# Patient Record
Sex: Female | Born: 1995 | Race: White | Hispanic: No | Marital: Single | Smoking: Former smoker
Health system: Southern US, Community
[De-identification: ages and names within clinical notes are randomized; demographics above are authoritative.]

## PROBLEM LIST (undated history)

## (undated) DIAGNOSIS — R112 Nausea with vomiting, unspecified: Secondary | ICD-10-CM

## (undated) DIAGNOSIS — F419 Anxiety disorder, unspecified: Secondary | ICD-10-CM

## (undated) DIAGNOSIS — F329 Major depressive disorder, single episode, unspecified: Secondary | ICD-10-CM

## (undated) DIAGNOSIS — Z9889 Other specified postprocedural states: Secondary | ICD-10-CM

## (undated) DIAGNOSIS — F32A Depression, unspecified: Secondary | ICD-10-CM

## (undated) DIAGNOSIS — F319 Bipolar disorder, unspecified: Secondary | ICD-10-CM

## (undated) HISTORY — PX: WISDOM TOOTH EXTRACTION: SHX21

---
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
## 2016-06-11 ENCOUNTER — Emergency Department (HOSPITAL_COMMUNITY)
Admission: EM | Admit: 2016-06-11 | Discharge: 2016-06-11 | Discharge status: Against Medical Advice | Payer: Self-pay | Attending: Emergency Medicine | Admitting: Emergency Medicine

## 2016-06-11 ENCOUNTER — Emergency Department (HOSPITAL_COMMUNITY): Payer: Self-pay

## 2016-06-11 ENCOUNTER — Encounter (HOSPITAL_COMMUNITY): Payer: Self-pay | Admitting: Nurse Practitioner

## 2016-06-11 DIAGNOSIS — R102 Pelvic and perineal pain: Secondary | ICD-10-CM

## 2016-06-11 DIAGNOSIS — Z3490 Encounter for supervision of normal pregnancy, unspecified, unspecified trimester: Secondary | ICD-10-CM

## 2016-06-11 DIAGNOSIS — Z3A01 Less than 8 weeks gestation of pregnancy: Secondary | ICD-10-CM | POA: Insufficient documentation

## 2016-06-11 DIAGNOSIS — O209 Hemorrhage in early pregnancy, unspecified: Secondary | ICD-10-CM | POA: Insufficient documentation

## 2016-06-11 DIAGNOSIS — N939 Abnormal uterine and vaginal bleeding, unspecified: Secondary | ICD-10-CM

## 2016-06-11 HISTORY — DX: Bipolar disorder, unspecified: F31.9

## 2016-06-11 HISTORY — DX: Anxiety disorder, unspecified: F41.9

## 2016-06-11 LAB — CBC WITH DIFFERENTIAL/PLATELET
Basophils Absolute: 0 10*3/uL (ref 0.0–0.1)
Basophils Relative: 0 %
Eosinophils Absolute: 0.1 10*3/uL (ref 0.0–0.7)
Eosinophils Relative: 1 %
HCT: 38.4 % (ref 36.0–46.0)
Hemoglobin: 12.4 g/dL (ref 12.0–15.0)
Lymphocytes Relative: 30 %
Lymphs Abs: 2.2 10*3/uL (ref 0.7–4.0)
MCH: 26.8 pg (ref 26.0–34.0)
MCHC: 32.3 g/dL (ref 30.0–36.0)
MCV: 82.9 fL (ref 78.0–100.0)
Monocytes Absolute: 0.6 10*3/uL (ref 0.1–1.0)
Monocytes Relative: 8 %
Neutro Abs: 4.6 10*3/uL (ref 1.7–7.7)
Neutrophils Relative %: 61 %
Platelets: 189 10*3/uL (ref 150–400)
RBC: 4.63 MIL/uL (ref 3.87–5.11)
RDW: 13.2 % (ref 11.5–15.5)
WBC: 7.5 10*3/uL (ref 4.0–10.5)

## 2016-06-11 LAB — URINE MICROSCOPIC-ADD ON

## 2016-06-11 LAB — URINALYSIS, ROUTINE W REFLEX MICROSCOPIC
Bilirubin Urine: NEGATIVE
Glucose, UA: NEGATIVE mg/dL
Ketones, ur: 15 mg/dL — AB
Nitrite: NEGATIVE
Protein, ur: NEGATIVE mg/dL
Specific Gravity, Urine: 1.013 (ref 1.005–1.030)
pH: 6 (ref 5.0–8.0)

## 2016-06-11 LAB — BASIC METABOLIC PANEL
Anion gap: 5 (ref 5–15)
BUN: 8 mg/dL (ref 6–20)
CO2: 25 mmol/L (ref 22–32)
Calcium: 9 mg/dL (ref 8.9–10.3)
Chloride: 106 mmol/L (ref 101–111)
Creatinine, Ser: 0.69 mg/dL (ref 0.44–1.00)
GFR calc Af Amer: >60 mL/min (ref >60)
GFR calc non Af Amer: >60 mL/min (ref >60)
Glucose, Bld: 96 mg/dL (ref 65–99)
Potassium: 3.4 mmol/L — ABNORMAL LOW (ref 3.5–5.1)
Sodium: 136 mmol/L (ref 135–145)

## 2016-06-11 LAB — WET PREP, GENITAL
Sperm: NONE SEEN
Trich, Wet Prep: NONE SEEN
Yeast Wet Prep HPF POC: NONE SEEN

## 2016-06-11 LAB — I-STAT BETA HCG BLOOD, ED (MC, WL, AP ONLY): I-stat hCG, quantitative: 904.2 m[IU]/mL — ABNORMAL HIGH (ref <5)

## 2016-06-11 LAB — ABO/RH: ABO/RH(D): A NEG

## 2016-06-11 NOTE — ED Notes (Signed)
PATIENT LEFT AMA 

## 2016-06-11 NOTE — ED Notes (Addendum)
Pt c/o onset pelvic cramping and dark vaginal bleeding this afternoon. Pain has decreased since onset. She is approximately [redacted] weeks pregnant, confirmed by PCP but has not been to OBGYN yet. She is alert and breathing easily

## 2016-06-11 NOTE — ED Notes (Signed)
Unable to locate pt. at room and at ER , pt. left AMA, EDP notified.

## 2016-06-11 NOTE — ED Provider Notes (Signed)
CSN: [id number]     Arrival date & time 06/11/16  1717 History   First MD Initiated Contact with Patient 06/11/16 1944     Chief Complaint  Patient presents with  . Vaginal Bleeding     (Consider location/radiation/quality/duration/timing/severity/associated sxs/prior Treatment) HPI Comments: Patient is a 20 year old pregnant female who presents with pelvic cramping and vaginal bleeding that began today. Patient reports she began having cramping while she was at work today. Patient was told that she could have some cramping and was not worried him until she began having dark vaginal bleeding. 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 Patient could not quantify how many pads she had filled due to using a brief. Patient has had some nausea over the past week, but no pelvic pain. Patient states she has not vomited, but has gagged once or twice without any productivity. Patient was seen by her PCP and was told she was [redacted] weeks pregnant due to her last menstrual period eating at the end of April. Patient denies any abnormal vaginal discharge. Patient has not taken any medications for her pain at home. Patient has an appointment with an OB/GYN tomorrow for her first appointment. Patient denies any chest pain, shortness of breath, vomiting, dysuria.  Patient is a 20 y.o. female presenting with vaginal bleeding. The history is provided by the patient.  Vaginal Bleeding Associated symptoms: nausea   Associated symptoms: no abdominal pain, no back pain, no dysuria and no fever     Past Medical History  Diagnosis Date  . Anxiety   . Bipolar disorder (HCC)    History reviewed. No pertinent past surgical history. History reviewed. No pertinent family history. Social History  Substance Use Topics  . Smoking status: Never Smoker   . Smokeless tobacco: None  . Alcohol Use: No   OB History    No data available     Review of Systems  Constitutional: Negative for fever and chills.  HENT:  Negative for facial swelling and sore throat.   Respiratory: Negative for shortness of breath.   Cardiovascular: Negative for chest pain.  Gastrointestinal: Positive for nausea. Negative for vomiting and abdominal pain.  Genitourinary: Positive for vaginal bleeding and pelvic pain. Negative for dysuria.  Musculoskeletal: Negative for back pain.  Skin: Negative for rash and wound.  Neurological: Negative for headaches.  Psychiatric/Behavioral: The patient is not nervous/anxious.       Allergies  Codeine  Home Medications   Prior to Admission medications   Not on File   BP 114/68 mmHg  Pulse 68  Temp(Src) 98.3 F (36.8 C) (Oral)  Resp 19  SpO2 100%  LMP 04/21/2016 Physical Exam  Constitutional: She appears well-developed and well-nourished. No distress.  HENT:  Head: Normocephalic and atraumatic.  Mouth/Throat: Oropharynx is clear and moist. No oropharyngeal exudate.  Eyes: Conjunctivae are normal. Pupils are equal, round, and reactive to light. Right eye exhibits no discharge. Left eye exhibits no discharge. No scleral icterus.  Neck: Normal range of motion. Neck supple. No thyromegaly present.  Cardiovascular: Normal rate, regular rhythm, normal heart sounds and intact distal pulses.  Exam reveals no gallop and no friction rub.   No murmur heard. Pulmonary/Chest: Effort normal and breath sounds normal. No stridor. No respiratory distress. She has no wheezes. She has no rales.  Abdominal: Soft. Bowel sounds are normal. She exhibits no distension. There is tenderness in the suprapubic area and left lower quadrant. There is no rebound and no guarding.  Genitourinary: There is no rash, tenderness or lesion on the right labia. There is no rash, tenderness or lesion on the left labia. Uterus is enlarged and tender. Cervix exhibits motion tenderness. Right adnexum displays tenderness. Right adnexum displays no mass and no fullness. Left adnexum displays no mass, no tenderness and  no fullness. There is bleeding in the vagina. No erythema or tenderness in the vagina. No foreign body around the vagina. No vaginal discharge found.  Bleeding with clots on pelvic exam  Musculoskeletal: She exhibits no edema.  Lymphadenopathy:    She has no cervical adenopathy.  Neurological: She is alert. Coordination normal.  Skin: Skin is warm and dry. No rash noted. She is not diaphoretic. No pallor.  Psychiatric: She has a normal mood and affect.  Nursing note and vitals reviewed.   ED Course  Procedures (including critical care time) Labs Review Labs Reviewed  I-STAT BETA HCG BLOOD, ED ([facility], [facility], [facility] ONLY) - Abnormal; Notable for the following:    I-stat hCG, quantitative 904.2 (*)    All other components within normal limits  WET PREP, GENITAL  BASIC METABOLIC PANEL  CBC WITH DIFFERENTIAL/PLATELET  RPR  HIV ANTIBODY (ROUTINE TESTING)  URINALYSIS, ROUTINE W REFLEX MICROSCOPIC (NOT AT [facility])  GC/CHLAMYDIA PROBE AMP ([place]) NOT AT [facility]    Imaging Review No results found. I have personally reviewed and evaluated these images and lab results as part of my medical decision-making.   EKG Interpretation None      MDM   CBC unremarkable. BMP shows potassium 3.4. UA shows large hematuria, 15 ketones, trace leukocytes, rare bacteria. Wet prep shows clue cells and moderate WBCs. GC chlamydia, RPR, HIV sent. Patient made aware that she will be called in 2-3 days if any of her results return positive. I made the patient aware that she should follow-up with the health Department or her OB/GYN if she is positive to arrange treatment. OB ultrasound shows no intrauterine gestational sac, yolk sac or fetal pole. Differential considerations include IUP too early to be sonographically visualized, missed abortion, or ectopic pregnancy; follow-up ultrasound is recommended in 10-14 days for further evaluation. Patient left AMA before discharge paperwork was given. I had spoken to patient and  was getting ready to discharge her while waiting for Rh factor to result. Rh factor is negative, and would have given Rhogam had patient not left. Patient had plans to follow up with OB/GYN tomorrow morning at her scheduled appointment. Strict return precautions were given. I advised patient she could take Tylenol for her pain. Patient vitals stable throughout ED course. I discussed patient case with Dr. Rubin Payor who is in agreement with plan.  Final diagnoses:  Vaginal bleeding  Pelvic cramping  Pregnant        Emi Holes, PA-C 06/12/16 0042  Benjiman Core, MD 06/13/16 2138

## 2016-06-12 LAB — GC/CHLAMYDIA PROBE AMP (~~LOC~~) NOT AT ARMC
Chlamydia: NEGATIVE
Neisseria Gonorrhea: NEGATIVE

## 2016-06-12 LAB — RPR: RPR Ser Ql: NONREACTIVE

## 2016-06-12 LAB — HIV ANTIBODY (ROUTINE TESTING W REFLEX): HIV Screen 4th Generation wRfx: NONREACTIVE

## 2016-07-17 ENCOUNTER — Emergency Department (HOSPITAL_COMMUNITY)
Admission: EM | Admit: 2016-07-17 | Discharge: 2016-07-18 | Disposition: A | Discharge status: Home / Self Care | Payer: Self-pay | Attending: Emergency Medicine | Admitting: Emergency Medicine

## 2016-07-17 ENCOUNTER — Emergency Department (HOSPITAL_COMMUNITY): Payer: Self-pay

## 2016-07-17 ENCOUNTER — Encounter (HOSPITAL_COMMUNITY): Payer: Self-pay | Admitting: Emergency Medicine

## 2016-07-17 DIAGNOSIS — Z79899 Other long term (current) drug therapy: Secondary | ICD-10-CM | POA: Insufficient documentation

## 2016-07-17 DIAGNOSIS — F319 Bipolar disorder, unspecified: Secondary | ICD-10-CM | POA: Insufficient documentation

## 2016-07-17 DIAGNOSIS — Z791 Long term (current) use of non-steroidal anti-inflammatories (NSAID): Secondary | ICD-10-CM | POA: Insufficient documentation

## 2016-07-17 DIAGNOSIS — R509 Fever, unspecified: Secondary | ICD-10-CM | POA: Insufficient documentation

## 2016-07-17 DIAGNOSIS — N12 Tubulo-interstitial nephritis, not specified as acute or chronic: Secondary | ICD-10-CM | POA: Insufficient documentation

## 2016-07-17 LAB — CBC
HCT: 38.1 % (ref 36.0–46.0)
Hemoglobin: 13 g/dL (ref 12.0–15.0)
MCH: 27.8 pg (ref 26.0–34.0)
MCHC: 34.1 g/dL (ref 30.0–36.0)
MCV: 81.4 fL (ref 78.0–100.0)
PLATELETS: 187 10*3/uL (ref 150–400)
RBC: 4.68 MIL/uL (ref 3.87–5.11)
RDW: 13.1 % (ref 11.5–15.5)
WBC: 9.8 10*3/uL (ref 4.0–10.5)

## 2016-07-17 LAB — URINE MICROSCOPIC-ADD ON

## 2016-07-17 LAB — COMPREHENSIVE METABOLIC PANEL
ALBUMIN: 4.1 g/dL (ref 3.5–5.0)
ALT: 13 U/L — ABNORMAL LOW (ref 14–54)
AST: 16 U/L (ref 15–41)
Alkaline Phosphatase: 78 U/L (ref 38–126)
Anion gap: 8 (ref 5–15)
BUN: 14 mg/dL (ref 6–20)
CHLORIDE: 106 mmol/L (ref 101–111)
CO2: 21 mmol/L — ABNORMAL LOW (ref 22–32)
CREATININE: 0.66 mg/dL (ref 0.44–1.00)
Calcium: 9.1 mg/dL (ref 8.9–10.3)
GFR calc Af Amer: >60 mL/min (ref >60)
GFR calc non Af Amer: >60 mL/min (ref >60)
GLUCOSE: 118 mg/dL — AB (ref 65–99)
POTASSIUM: 3.1 mmol/L — AB (ref 3.5–5.1)
Sodium: 135 mmol/L (ref 135–145)
Total Bilirubin: 0.8 mg/dL (ref 0.3–1.2)
Total Protein: 7.2 g/dL (ref 6.5–8.1)

## 2016-07-17 LAB — URINALYSIS, ROUTINE W REFLEX MICROSCOPIC
Bilirubin Urine: NEGATIVE
GLUCOSE, UA: NEGATIVE mg/dL
Ketones, ur: 40 mg/dL — AB
Nitrite: NEGATIVE
PROTEIN: NEGATIVE mg/dL
Specific Gravity, Urine: 1.011 (ref 1.005–1.030)
pH: 6 (ref 5.0–8.0)

## 2016-07-17 LAB — LIPASE, BLOOD: LIPASE: 19 U/L (ref 11–51)

## 2016-07-17 LAB — I-STAT BETA HCG BLOOD, ED (MC, WL, AP ONLY): I-stat hCG, quantitative: <5 m[IU]/mL (ref <5)

## 2016-07-17 MED ORDER — DEXTROSE 5 % IV SOLN
1.0000 g | INTRAVENOUS | Status: DC
  Administered 2016-07-18: 1 g via INTRAVENOUS
  Filled 2016-07-17: qty 10

## 2016-07-17 MED ORDER — IOPAMIDOL (ISOVUE-300) INJECTION 61%
100.0000 mL | Freq: Once | INTRAVENOUS | Status: AC | PRN
  Administered 2016-07-17: 100 mL via INTRAVENOUS

## 2016-07-17 MED ORDER — OXYCODONE-ACETAMINOPHEN 5-325 MG PO TABS: 1.0000 | ORAL_TABLET | ORAL | Status: DC | PRN

## 2016-07-17 MED ORDER — CEPHALEXIN 500 MG PO CAPS: 500.0000 mg | ORAL_CAPSULE | Freq: Four times a day (QID) | ORAL | Status: DC

## 2016-07-17 MED ORDER — SODIUM CHLORIDE 0.9 % IV BOLUS (SEPSIS)
1000.0000 mL | Freq: Once | INTRAVENOUS | Status: AC
  Administered 2016-07-17: 1000 mL via INTRAVENOUS

## 2016-07-17 MED ORDER — SODIUM CHLORIDE 0.9 % IV SOLN
INTRAVENOUS | Status: DC
  Administered 2016-07-18: via INTRAVENOUS

## 2016-07-17 MED ORDER — ONDANSETRON HCL 4 MG/2ML IJ SOLN
4.0000 mg | Freq: Once | INTRAMUSCULAR | Status: AC
  Administered 2016-07-17: 4 mg via INTRAVENOUS
  Filled 2016-07-17: qty 2

## 2016-07-17 MED ORDER — ONDANSETRON 4 MG PO TBDP
4.0000 mg | ORAL_TABLET | Freq: Once | ORAL | Status: AC | PRN
  Administered 2016-07-17: 4 mg via ORAL
  Filled 2016-07-17: qty 1

## 2016-07-17 NOTE — ED Notes (Signed)
Patient transported to CT 

## 2016-07-17 NOTE — ED Notes (Signed)
Pt states she has had abdominal pain and fever since yesterday. States she has been SOB and a fever as high as 103 at home. 99.2 now with tylenol at 6pm. Alert and oriented. Sent from [place] for further eval.

## 2016-07-17 NOTE — ED Provider Notes (Signed)
CSN: [id number]     Arrival date & time 07/17/16  1955 History   First MD Initiated Contact with Patient 07/17/16 2135     Chief Complaint  Patient presents with  . Abdominal Pain  . Fever     (Consider location/radiation/quality/duration/timing/severity/associated sxs/prior Treatment) HPI Comments: 21 year old female presents with one-day history of left lower quadrant abdominal pain with fever. Pain is characterized as sharp and worse with movement. Has had some urinary frequency. No vomiting or diarrhea. Is on the last day of her menses. Symptoms worse with walking. No prior history of same. No treatment use prior to arrival. Micah Flesher to urgent care and sent to further evaluation.  Patient is a 20 y.o. female presenting with abdominal pain and fever. The history is provided by the patient and a parent.  Abdominal Pain Associated symptoms: fever   Fever   Past Medical History  Diagnosis Date  . Anxiety   . Bipolar disorder (HCC)    History reviewed. No pertinent past surgical history. History reviewed. No pertinent family history. Social History  Substance Use Topics  . Smoking status: Never Smoker   . Smokeless tobacco: None  . Alcohol Use: No   OB History    No data available     Review of Systems  Constitutional: Positive for fever.  Gastrointestinal: Positive for abdominal pain.  All other systems reviewed and are negative.     Allergies  Codeine  Home Medications   Prior to Admission medications   Medication Sig Start Date End Date Taking? Authorizing Provider  amphetamine-dextroamphetamine (ADDERALL) 5 MG tablet Take 5 mg by mouth daily as needed (for attention).    Historical Provider, MD  ibuprofen (ADVIL,MOTRIN) 800 MG tablet Take 800 mg by mouth every 8 (eight) hours as needed for moderate pain.    Historical Provider, MD  Prenatal Vit-Fe Fumarate-FA (MULTIVITAMIN-PRENATAL) 27-0.8 MG TABS tablet Take 1 tablet by mouth daily at 12 noon.    Historical  Provider, MD  propranolol (INDERAL) 10 MG tablet Take 10 mg by mouth daily as needed (for anxiety).    Historical Provider, MD   BP 110/63 mmHg  Pulse 116  Temp(Src) 99.2 F (37.3 C) (Oral)  Resp 18  SpO2 97%  LMP 07/12/2016 (Exact Date) Physical Exam  Constitutional: She is oriented to person, place, and time. She appears well-developed and well-nourished.  Non-toxic appearance. No distress.  HENT:  Head: Normocephalic and atraumatic.  Eyes: Conjunctivae, EOM and lids are normal. Pupils are equal, round, and reactive to light.  Neck: Normal range of motion. Neck supple. No tracheal deviation present. No thyroid mass present.  Cardiovascular: Regular rhythm and normal heart sounds.  Tachycardia present.  Exam reveals no gallop.   No murmur heard. Pulmonary/Chest: Effort normal and breath sounds normal. No stridor. No respiratory distress. She has no decreased breath sounds. She has no wheezes. She has no rhonchi. She has no rales.  Abdominal: Soft. Normal appearance and bowel sounds are normal. She exhibits no distension. There is tenderness in the left lower quadrant. There is guarding. There is no rigidity, no rebound and no CVA tenderness.    Musculoskeletal: Normal range of motion. She exhibits no edema or tenderness.  Neurological: She is alert and oriented to person, place, and time. She has normal strength. No cranial nerve deficit or sensory deficit. GCS eye subscore is 4. GCS verbal subscore is 5. GCS motor subscore is 6.  Skin: Skin is warm and dry. No abrasion and no rash noted.  Psychiatric: She has a normal mood and affect. Her speech is normal and behavior is normal.  Nursing note and vitals reviewed.   ED Course  Procedures (including critical care time) Labs Review Labs Reviewed  COMPREHENSIVE METABOLIC PANEL - Abnormal; Notable for the following:    Potassium 3.1 (*)    CO2 21 (*)    Glucose, Bld 118 (*)    ALT 13 (*)    All other components within normal limits   LIPASE, BLOOD  CBC  URINALYSIS, ROUTINE W REFLEX MICROSCOPIC (NOT AT [facility])  I-STAT BETA HCG BLOOD, ED ([facility], [facility], [facility] ONLY)    Imaging Review No results found. I have personally reviewed and evaluated these images and lab results as part of my medical decision-making.   EKG Interpretation None      MDM   Final diagnoses:  None    Patient given dose of IV Rocephin here and placed on Keflex as well as Percocet for pain    Lorre NickAnthony Kellis Mcadam, MD 07/17/16 2358

## 2016-07-17 NOTE — Discharge Instructions (Signed)

## 2016-07-18 MED ORDER — MORPHINE SULFATE (PF) 4 MG/ML IV SOLN
4.0000 mg | Freq: Once | INTRAVENOUS | Status: AC
  Administered 2016-07-18: 4 mg via INTRAVENOUS
  Filled 2016-07-18: qty 1

## 2016-07-18 MED ORDER — KETOROLAC TROMETHAMINE 30 MG/ML IJ SOLN
30.0000 mg | Freq: Once | INTRAMUSCULAR | Status: AC
  Administered 2016-07-18: 30 mg via INTRAVENOUS
  Filled 2016-07-18: qty 1

## 2016-07-18 MED ORDER — ONDANSETRON HCL 4 MG/2ML IJ SOLN
4.0000 mg | Freq: Once | INTRAMUSCULAR | Status: AC
  Administered 2016-07-18: 4 mg via INTRAVENOUS
  Filled 2016-07-18: qty 2

## 2016-07-22 ENCOUNTER — Emergency Department (HOSPITAL_COMMUNITY): Payer: 59

## 2016-07-22 ENCOUNTER — Emergency Department (HOSPITAL_COMMUNITY)
Admission: EM | Admit: 2016-07-22 | Discharge: 2016-07-22 | Disposition: A | Discharge status: Home / Self Care | Payer: 59 | Attending: Emergency Medicine | Admitting: Emergency Medicine

## 2016-07-22 ENCOUNTER — Encounter (HOSPITAL_COMMUNITY): Payer: Self-pay

## 2016-07-22 DIAGNOSIS — Z791 Long term (current) use of non-steroidal anti-inflammatories (NSAID): Secondary | ICD-10-CM | POA: Diagnosis not present

## 2016-07-22 DIAGNOSIS — R1084 Generalized abdominal pain: Secondary | ICD-10-CM | POA: Diagnosis not present

## 2016-07-22 DIAGNOSIS — Z79899 Other long term (current) drug therapy: Secondary | ICD-10-CM | POA: Diagnosis not present

## 2016-07-22 DIAGNOSIS — R112 Nausea with vomiting, unspecified: Secondary | ICD-10-CM | POA: Insufficient documentation

## 2016-07-22 DIAGNOSIS — Z79891 Long term (current) use of opiate analgesic: Secondary | ICD-10-CM | POA: Insufficient documentation

## 2016-07-22 DIAGNOSIS — R509 Fever, unspecified: Secondary | ICD-10-CM | POA: Diagnosis present

## 2016-07-22 DIAGNOSIS — F319 Bipolar disorder, unspecified: Secondary | ICD-10-CM | POA: Diagnosis not present

## 2016-07-22 LAB — HEPATIC FUNCTION PANEL
ALT: 51 U/L (ref 14–54)
AST: 74 U/L — ABNORMAL HIGH (ref 15–41)
Albumin: 3.9 g/dL (ref 3.5–5.0)
Alkaline Phosphatase: 62 U/L (ref 38–126)
TOTAL PROTEIN: 7.3 g/dL (ref 6.5–8.1)
Total Bilirubin: 0.4 mg/dL (ref 0.3–1.2)

## 2016-07-22 LAB — CBC WITH DIFFERENTIAL/PLATELET
Basophils Absolute: 0 10*3/uL (ref 0.0–0.1)
Basophils Relative: 0 %
EOS ABS: 0.1 10*3/uL (ref 0.0–0.7)
Eosinophils Relative: 2 %
HCT: 36.9 % (ref 36.0–46.0)
Hemoglobin: 12.2 g/dL (ref 12.0–15.0)
Lymphocytes Relative: 46 %
Lymphs Abs: 2.3 10*3/uL (ref 0.7–4.0)
MCH: 27.2 pg (ref 26.0–34.0)
MCHC: 33.1 g/dL (ref 30.0–36.0)
MCV: 82.2 fL (ref 78.0–100.0)
MONO ABS: 0.4 10*3/uL (ref 0.1–1.0)
Monocytes Relative: 7 %
NEUTROS PCT: 45 %
Neutro Abs: 2.2 10*3/uL (ref 1.7–7.7)
PLATELETS: 224 10*3/uL (ref 150–400)
RBC: 4.49 MIL/uL (ref 3.87–5.11)
RDW: 13.2 % (ref 11.5–15.5)
WBC: 5 10*3/uL (ref 4.0–10.5)

## 2016-07-22 LAB — BASIC METABOLIC PANEL
ANION GAP: 8 (ref 5–15)
BUN: 13 mg/dL (ref 6–20)
CALCIUM: 9 mg/dL (ref 8.9–10.3)
CO2: 24 mmol/L (ref 22–32)
Chloride: 107 mmol/L (ref 101–111)
Creatinine, Ser: 0.65 mg/dL (ref 0.44–1.00)
GFR calc Af Amer: >60 mL/min (ref >60)
Glucose, Bld: 91 mg/dL (ref 65–99)
POTASSIUM: 3.8 mmol/L (ref 3.5–5.1)
SODIUM: 139 mmol/L (ref 135–145)

## 2016-07-22 LAB — URINALYSIS, ROUTINE W REFLEX MICROSCOPIC
Bilirubin Urine: NEGATIVE
Glucose, UA: NEGATIVE mg/dL
HGB URINE DIPSTICK: NEGATIVE
KETONES UR: 15 mg/dL — AB
LEUKOCYTES UA: NEGATIVE
Nitrite: NEGATIVE
PROTEIN: NEGATIVE mg/dL
Specific Gravity, Urine: 1.024 (ref 1.005–1.030)
pH: 6.5 (ref 5.0–8.0)

## 2016-07-22 LAB — LIPASE, BLOOD: Lipase: 22 U/L (ref 11–51)

## 2016-07-22 MED ORDER — ONDANSETRON 4 MG PO TBDP: 4.0000 mg | ORAL_TABLET | Freq: Three times a day (TID) | ORAL | 0 refills | Status: DC | PRN

## 2016-07-22 MED ORDER — TRAMADOL HCL 50 MG PO TABS: 50.0000 mg | ORAL_TABLET | Freq: Four times a day (QID) | ORAL | 0 refills | Status: DC | PRN

## 2016-07-22 MED ORDER — KETOROLAC TROMETHAMINE 30 MG/ML IJ SOLN
30.0000 mg | Freq: Once | INTRAMUSCULAR | Status: AC
  Administered 2016-07-22: 30 mg via INTRAVENOUS
  Filled 2016-07-22: qty 1

## 2016-07-22 MED ORDER — MORPHINE SULFATE (PF) 2 MG/ML IV SOLN
2.0000 mg | Freq: Once | INTRAVENOUS | Status: AC
  Administered 2016-07-22: 2 mg via INTRAVENOUS
  Filled 2016-07-22: qty 1

## 2016-07-22 MED ORDER — DICYCLOMINE HCL 20 MG PO TABS: 20.0000 mg | ORAL_TABLET | Freq: Two times a day (BID) | ORAL | 0 refills | Status: DC | PRN

## 2016-07-22 MED ORDER — FENTANYL CITRATE (PF) 100 MCG/2ML IJ SOLN: 50.0000 ug | Freq: Once | INTRAMUSCULAR | Status: DC

## 2016-07-22 MED ORDER — SODIUM CHLORIDE 0.9 % IV BOLUS (SEPSIS)
1000.0000 mL | Freq: Once | INTRAVENOUS | Status: AC
Start: 2016-07-22 — End: 2016-07-22
  Administered 2016-07-22: 1000 mL via INTRAVENOUS

## 2016-07-22 MED ORDER — ONDANSETRON HCL 4 MG/2ML IJ SOLN
4.0000 mg | Freq: Once | INTRAMUSCULAR | Status: AC
  Administered 2016-07-22: 4 mg via INTRAVENOUS
  Filled 2016-07-22: qty 2

## 2016-07-22 NOTE — ED Notes (Signed)
Ultrasound at the bedside

## 2016-07-22 NOTE — ED Provider Notes (Signed)
[facility]-EMERGENCY DEPT Provider Note   CSN: [id number] Arrival date & time: 07/22/16  4098  First Provider Contact:  First MD Initiated Contact with Patient 07/22/16 1719     History   Chief Complaint Chief Complaint  Patient presents with  . Fever  . Emesis   HPI  Lisa Fitzpatrick is an 20 y.o. female with history of anxiety and bipolar disorder who presents to the ED for evaluation of fever and abdominal pain. She was seen in the ED on 7/20 and diagnosed with pyelonephritis. She was treated with a dose of rocephin and discharged home with rx for keflex and percocet. Pt states she has been taking keflex as prescribed. However, pt states she cannot take oxycodone or hydrocodone as her throat becomes swollen and itchy so she has not been taking the percocet. She states she has been febrile at home to the low 100s which resolves with tylenol or ibuprofen. She states her pain has now spread and is all over her abdomen. She states the pain is so bad it is making her nauseated and she has thrown up. Denies diarrhea. Denies feeling faint or lightheaded. Denies chest pain or SOB.  Past Medical History:  Diagnosis Date  . Anxiety   . Bipolar disorder (HCC)     There are no active problems to display for this patient.   History reviewed. No pertinent surgical history.  OB History    No data available       Home Medications    Prior to Admission medications   Medication Sig Start Date End Date Taking? Authorizing Provider  acetaminophen (TYLENOL) 500 MG tablet Take 500 mg by mouth every 6 (six) hours as needed for fever.    Yes Historical Provider, MD  bismuth subsalicylate (PEPTO BISMOL) 262 MG/15ML suspension Take 30 mLs by mouth every 6 (six) hours as needed (nausea).    Yes Historical Provider, MD  cephALEXin (KEFLEX) 500 MG capsule Take 1 capsule (500 mg total) by mouth 4 (four) times daily. 07/17/16  Yes Lorre Nick, MD  CRANBERRY PO Take 1 tablet by mouth daily as needed (kidney  function).   Yes Historical Provider, MD  ibuprofen (ADVIL,MOTRIN) 200 MG tablet Take 400 mg by mouth every 6 (six) hours as needed for fever or moderate pain.   Yes Historical Provider, MD    Family History History reviewed. No pertinent family history.  Social History Social History  Substance Use Topics  . Smoking status: Never Smoker  . Smokeless tobacco: Never Used  . Alcohol use No     Allergies   Codeine   Review of Systems Review of Systems  All other systems reviewed and are negative.    Physical Exam Updated Vital Signs BP 117/59 (BP Location: Left Arm)   Pulse (!) 58   Temp 98.4 F (36.9 C) (Oral)   Resp 16   LMP 07/12/2016 (Exact Date) Comment: HCG neg 07/17/16  SpO2 96%   Physical Exam  Constitutional: She is oriented to person, place, and time.  HENT:  Right Ear: External ear normal.  Left Ear: External ear normal.  Nose: Nose normal.  Mouth/Throat: Oropharynx is clear and moist. No oropharyngeal exudate.  Eyes: Conjunctivae and EOM are normal. Pupils are equal, round, and reactive to light.  Neck: Normal range of motion. Neck supple.  Cardiovascular: Normal rate, regular rhythm, normal heart sounds and intact distal pulses.   Pulmonary/Chest: Effort normal and breath sounds normal. No respiratory distress. She has no wheezes. She  exhibits no tenderness.  Abdominal: Soft. Bowel sounds are normal. She exhibits no distension. There is no rebound and no guarding.  Minimal diffuse abdominal tenderness. No rebound or guarding. Abdomen soft. No CVA tenderness  Musculoskeletal: She exhibits no edema.  Neurological: She is alert and oriented to person, place, and time. No cranial nerve deficit.  Skin: Skin is warm and dry.  Psychiatric: She has a normal mood and affect.  Nursing note and vitals reviewed.    ED Treatments / Results  Labs (all labs ordered are listed, but only abnormal results are displayed) Labs Reviewed  URINALYSIS, ROUTINE W REFLEX  MICROSCOPIC (NOT AT [facility]) - Abnormal; Notable for the following:       Result Value   Ketones, ur 15 (*)    All other components within normal limits  HEPATIC FUNCTION PANEL - Abnormal; Notable for the following:    AST 74 (*)    Bilirubin, Direct <0.1 (*)    All other components within normal limits  CBC WITH DIFFERENTIAL/PLATELET  BASIC METABOLIC PANEL  LIPASE, BLOOD  PREGNANCY, URINE    EKG  EKG Interpretation None       Radiology US Abdomen Complete  Result Date: 07/22/2016 CLINICAL DATA:  Recent pyelonephritis with persistent abdominal pain, initial encounter EXAM: ABDOMEN ULTRASOUND COMPLETE COMPARISON:  07/17/2016 FINDINGS: Gallbladder: Well distended with multiple gallstones. No pericholecystic fluid is noted. Common bile duct: Diameter: 3.4 mm. Liver: No focal lesion identified. Within normal limits in parenchymal echogenicity. IVC: No abnormality visualized. Pancreas: Visualized portion unremarkable. Spleen: Size and appearance within normal limits. Right Kidney: Length: 11 cm. Echogenicity within normal limits. No mass or hydronephrosis visualized. Left Kidney: Length: 10.7 cm. Echogenicity within normal limits. No mass or hydronephrosis visualized. Abdominal aorta: No aneurysmal dilatation is noted. There is suggestion of a hyperechoic structure within the midportion of the aorta although recent CT shows no abnormality in this area. This is likely artifactual in nature Other findings: None. IMPRESSION: Multiple gallstones without complicating factors. No other focal abnormality is noted. Electronically Signed   By: Alcide Clever M.D.   On: 07/22/2016 18:33   Procedures Procedures (including critical care time)  Medications Ordered in ED Medications  sodium chloride 0.9 % bolus 1,000 mL (0 mLs Intravenous Stopped 07/22/16 1940)  ondansetron (ZOFRAN) injection 4 mg (4 mg Intravenous Given 07/22/16 1805)  morphine 2 MG/ML injection 2 mg (2 mg Intravenous Given 07/22/16 1806)    ketorolac (TORADOL) 30 MG/ML injection 30 mg (30 mg Intravenous Given 07/22/16 1937)  morphine 2 MG/ML injection 2 mg (2 mg Intravenous Given 07/22/16 1937)     Initial Impression / Assessment and Plan / ED Course  I have reviewed the triage vital signs and the nursing notes.  Pertinent labs & imaging results that were available during my care of the patient were reviewed by me and considered in my medical decision making (see chart for details).  Pain improved in the ED. Labs reveal an AST of 74, 15 ketones in urine. Abd [place] was ordered given degree of pain and reveals multiple gallstones without complicating factors. No hydronephrosis of kidneys. Pt nontoxic appearing and afebrile in the ED. Will d/c home with anti-emetics. Will trial other meds to help with abdominal pain as pt states she cannot take hydrocodone or oxycodone and pain is not relieve with ibuprofen/tylenol alone. Instructed close PCP and GI follow up. ER return precautions given.  Final Clinical Impressions(s) / ED Diagnoses   Final diagnoses:  Generalized abdominal pain  Non-intractable vomiting with nausea, vomiting of unspecified type    New Prescriptions New Prescriptions   DICYCLOMINE (BENTYL) 20 MG TABLET    Take 1 tablet (20 mg total) by mouth 2 (two) times daily as needed (abdominal pain).   ONDANSETRON (ZOFRAN ODT) 4 MG DISINTEGRATING TABLET    Take 1 tablet (4 mg total) by mouth every 8 (eight) hours as needed for nausea or vomiting.   TRAMADOL (ULTRAM) 50 MG TABLET    Take 1 tablet (50 mg total) by mouth every 6 (six) hours as needed (pain).     Carlene Coria, PA-C 07/22/16 2000    Mancel Bale, MD 07/29/16 [id number]

## 2016-07-22 NOTE — ED Triage Notes (Signed)
Per pt, dx 5 days ago with uti and given antibiotics.  Pt here with continued fever and flank pain. Notified MD and stated she will probably need admitted.

## 2016-07-22 NOTE — Discharge Instructions (Signed)
Finish all of your antibiotics as prescribed. I will give you a few prescriptions to try to help with your symptoms. Follow up with your primary care provider as soon as possible. Return to the ER for new or worsening symptoms.

## 2016-08-18 ENCOUNTER — Encounter (HOSPITAL_COMMUNITY): Payer: Self-pay | Admitting: Emergency Medicine

## 2016-08-18 ENCOUNTER — Emergency Department (HOSPITAL_COMMUNITY)
Admission: EM | Admit: 2016-08-18 | Discharge: 2016-08-19 | Disposition: A | Discharge status: Other Acute Inpatient Hospital | Payer: 59 | Attending: Physician Assistant | Admitting: Physician Assistant

## 2016-08-18 DIAGNOSIS — F1290 Cannabis use, unspecified, uncomplicated: Secondary | ICD-10-CM | POA: Insufficient documentation

## 2016-08-18 DIAGNOSIS — F17200 Nicotine dependence, unspecified, uncomplicated: Secondary | ICD-10-CM | POA: Diagnosis not present

## 2016-08-18 DIAGNOSIS — Z79899 Other long term (current) drug therapy: Secondary | ICD-10-CM | POA: Diagnosis not present

## 2016-08-18 DIAGNOSIS — F319 Bipolar disorder, unspecified: Secondary | ICD-10-CM | POA: Diagnosis not present

## 2016-08-18 DIAGNOSIS — R45851 Suicidal ideations: Secondary | ICD-10-CM | POA: Diagnosis present

## 2016-08-18 LAB — COMPREHENSIVE METABOLIC PANEL
ALBUMIN: 4.2 g/dL (ref 3.5–5.0)
ALT: 11 U/L — ABNORMAL LOW (ref 14–54)
ANION GAP: 4 — AB (ref 5–15)
AST: 16 U/L (ref 15–41)
Alkaline Phosphatase: 75 U/L (ref 38–126)
BILIRUBIN TOTAL: 0.4 mg/dL (ref 0.3–1.2)
BUN: 16 mg/dL (ref 6–20)
CO2: 27 mmol/L (ref 22–32)
Calcium: 9.3 mg/dL (ref 8.9–10.3)
Chloride: 109 mmol/L (ref 101–111)
Creatinine, Ser: 0.75 mg/dL (ref 0.44–1.00)
GFR calc non Af Amer: >60 mL/min (ref >60)
GLUCOSE: 97 mg/dL (ref 65–99)
POTASSIUM: 3.7 mmol/L (ref 3.5–5.1)
Sodium: 140 mmol/L (ref 135–145)
TOTAL PROTEIN: 7.3 g/dL (ref 6.5–8.1)

## 2016-08-18 LAB — CBC
HEMATOCRIT: 39.5 % (ref 36.0–46.0)
Hemoglobin: 13.1 g/dL (ref 12.0–15.0)
MCH: 27.4 pg (ref 26.0–34.0)
MCHC: 33.2 g/dL (ref 30.0–36.0)
MCV: 82.6 fL (ref 78.0–100.0)
PLATELETS: 214 10*3/uL (ref 150–400)
RBC: 4.78 MIL/uL (ref 3.87–5.11)
RDW: 13.7 % (ref 11.5–15.5)
WBC: 7.1 10*3/uL (ref 4.0–10.5)

## 2016-08-18 LAB — I-STAT BETA HCG BLOOD, ED (MC, WL, AP ONLY): I-stat hCG, quantitative: <5 m[IU]/mL (ref <5)

## 2016-08-18 LAB — RAPID URINE DRUG SCREEN, HOSP PERFORMED
Amphetamines: NOT DETECTED
BENZODIAZEPINES: NOT DETECTED
Barbiturates: NOT DETECTED
COCAINE: NOT DETECTED
OPIATES: NOT DETECTED
Tetrahydrocannabinol: POSITIVE — AB

## 2016-08-18 LAB — SALICYLATE LEVEL

## 2016-08-18 LAB — ACETAMINOPHEN LEVEL

## 2016-08-18 LAB — ETHANOL

## 2016-08-18 MED ORDER — LORAZEPAM 1 MG PO TABS
2.0000 mg | ORAL_TABLET | Freq: Four times a day (QID) | ORAL | Status: DC | PRN
  Administered 2016-08-18 – 2016-08-19 (×2): 2 mg via ORAL
  Filled 2016-08-18 (×2): qty 2

## 2016-08-18 NOTE — ED Notes (Signed)
Pt has in belonging bag:  Grey scrub top and bottom, black shoes, black phone, [place] drive lic, [employer] [id number]

## 2016-08-18 NOTE — ED Notes (Signed)
Pt presents with SI, plan to OD on pills or drive car off cliff.  Pt is IVC, tearful at present.  Pt reports she argued with mother and mother kicked her out of the house.  Pt reports she has been living in her car for the past 2 days.  Feeling hopeless,  Denies HI or AVH.  Pt reports she is diagnosed with Bipolar DO, doesn't believe the diagnosis to be true and does not take any Rx meds.  Pt reports her mother hates her boyfriend who lives in car also.  Mother has taken a restraining order out on boyfriend also.  Pt A&O x 3, no distress noted, cooperative and tearful at present.  Monitoring for safety, Q 15 min checks in effect.

## 2016-08-18 NOTE — ED Triage Notes (Addendum)
Lisa Fitzpatrick after being IVC'd by her mother. Paperwork states that Lisa has made suicidal statements about wanting to overdose on pills recently and has been telling her family members 'goodbye' and being aggressive with them. Lisa does not take her prescribed bipolar meds and has been smoking marijuana daily, per mother report. Upon speaking with patient she does not deny most of these statements but states that she has had a hard life lately and that she was going to driver her car off of a cliff. States that she doesn't like taking her meds because they hurt her stomach even if they make her feel better mentally. Alert and oriented.

## 2016-08-18 NOTE — ED Notes (Signed)
Pt complaining of anxiety, tearful.  PA Spencer called, med orders given.

## 2016-08-19 ENCOUNTER — Encounter (HOSPITAL_COMMUNITY): Payer: Self-pay | Admitting: General Practice

## 2016-08-19 ENCOUNTER — Inpatient Hospital Stay (HOSPITAL_COMMUNITY)
Admission: AD | Admit: 2016-08-19 | Discharge: 2016-08-24 | DRG: 885 | Disposition: A | Discharge status: Home / Self Care | Payer: 59 | Attending: Psychiatry | Admitting: Psychiatry

## 2016-08-19 DIAGNOSIS — F332 Major depressive disorder, recurrent severe without psychotic features: Secondary | ICD-10-CM

## 2016-08-19 DIAGNOSIS — Z590 Homelessness: Secondary | ICD-10-CM | POA: Diagnosis not present

## 2016-08-19 DIAGNOSIS — F419 Anxiety disorder, unspecified: Secondary | ICD-10-CM | POA: Diagnosis present

## 2016-08-19 DIAGNOSIS — F1220 Cannabis dependence, uncomplicated: Secondary | ICD-10-CM | POA: Diagnosis present

## 2016-08-19 DIAGNOSIS — F319 Bipolar disorder, unspecified: Secondary | ICD-10-CM | POA: Diagnosis present

## 2016-08-19 DIAGNOSIS — Z915 Personal history of self-harm: Secondary | ICD-10-CM | POA: Diagnosis not present

## 2016-08-19 DIAGNOSIS — R45851 Suicidal ideations: Secondary | ICD-10-CM | POA: Diagnosis present

## 2016-08-19 DIAGNOSIS — F3130 Bipolar disorder, current episode depressed, mild or moderate severity, unspecified: Secondary | ICD-10-CM | POA: Diagnosis present

## 2016-08-19 DIAGNOSIS — F17200 Nicotine dependence, unspecified, uncomplicated: Secondary | ICD-10-CM | POA: Diagnosis present

## 2016-08-19 MED ORDER — CARBAMAZEPINE 200 MG PO TABS: 200.0000 mg | ORAL_TABLET | Freq: Two times a day (BID) | ORAL | Status: DC

## 2016-08-19 MED ORDER — AMPHETAMINE-DEXTROAMPHETAMINE 10 MG PO TABS: 15.0000 mg | ORAL_TABLET | Freq: Two times a day (BID) | ORAL | Status: DC | PRN

## 2016-08-19 MED ORDER — IBUPROFEN 600 MG PO TABS
600.0000 mg | ORAL_TABLET | Freq: Three times a day (TID) | ORAL | Status: DC | PRN
  Administered 2016-08-23 (×2): 600 mg via ORAL
  Filled 2016-08-19 (×2): qty 1

## 2016-08-19 MED ORDER — TRAZODONE HCL 50 MG PO TABS: 50.0000 mg | ORAL_TABLET | Freq: Every evening | ORAL | Status: DC | PRN

## 2016-08-19 MED ORDER — MAGNESIUM HYDROXIDE 400 MG/5ML PO SUSP: 30.0000 mL | Freq: Every day | ORAL | Status: DC | PRN

## 2016-08-19 MED ORDER — ACETAMINOPHEN 325 MG PO TABS: 650.0000 mg | ORAL_TABLET | ORAL | Status: DC | PRN

## 2016-08-19 MED ORDER — ZOLPIDEM TARTRATE 5 MG PO TABS: 5.0000 mg | ORAL_TABLET | Freq: Every evening | ORAL | Status: DC | PRN

## 2016-08-19 MED ORDER — IBUPROFEN 200 MG PO TABS: 600.0000 mg | ORAL_TABLET | Freq: Three times a day (TID) | ORAL | Status: DC | PRN

## 2016-08-19 MED ORDER — LORAZEPAM 1 MG PO TABS
2.0000 mg | ORAL_TABLET | Freq: Four times a day (QID) | ORAL | Status: DC | PRN
  Administered 2016-08-19: 2 mg via ORAL
  Filled 2016-08-19: qty 2

## 2016-08-19 MED ORDER — CARBAMAZEPINE 200 MG PO TABS
200.0000 mg | ORAL_TABLET | Freq: Two times a day (BID) | ORAL | Status: DC
  Administered 2016-08-19 – 2016-08-20 (×2): 200 mg via ORAL
  Filled 2016-08-19 (×7): qty 1

## 2016-08-19 MED ORDER — ACETAMINOPHEN 325 MG PO TABS
650.0000 mg | ORAL_TABLET | ORAL | Status: DC | PRN
  Administered 2016-08-22: 650 mg via ORAL
  Filled 2016-08-19: qty 2

## 2016-08-19 MED ORDER — DICYCLOMINE HCL 20 MG PO TABS
20.0000 mg | ORAL_TABLET | Freq: Two times a day (BID) | ORAL | Status: DC | PRN
  Administered 2016-08-23 (×2): 20 mg via ORAL
  Filled 2016-08-19 (×2): qty 1

## 2016-08-19 MED ORDER — ALUM & MAG HYDROXIDE-SIMETH 200-200-20 MG/5ML PO SUSP: 30.0000 mL | ORAL | Status: DC | PRN

## 2016-08-19 MED ORDER — DICYCLOMINE HCL 20 MG PO TABS: 20.0000 mg | ORAL_TABLET | Freq: Two times a day (BID) | ORAL | Status: DC | PRN

## 2016-08-19 MED ORDER — ONDANSETRON HCL 4 MG PO TABS
4.0000 mg | ORAL_TABLET | Freq: Three times a day (TID) | ORAL | Status: DC | PRN
  Administered 2016-08-20 – 2016-08-24 (×6): 4 mg via ORAL
  Filled 2016-08-19 (×7): qty 1

## 2016-08-19 MED ORDER — ONDANSETRON HCL 4 MG PO TABS: 4.0000 mg | ORAL_TABLET | Freq: Three times a day (TID) | ORAL | Status: DC | PRN

## 2016-08-19 MED ORDER — ALUM & MAG HYDROXIDE-SIMETH 200-200-20 MG/5ML PO SUSP
30.0000 mL | ORAL | Status: DC | PRN
  Administered 2016-08-23: 30 mL via ORAL
  Filled 2016-08-19: qty 30
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
## 2016-08-19 NOTE — BH Assessment (Signed)
[facility] Assessment Progress Note  Per Thedore MinsMojeed Akintayo, MD, this pt requires psychiatric hospitalization at this time.  Berneice Heinrichina Tate, RN, [facility].  Pt presents under IVC which Dr Jannifer FranklinAkintayo has upheld, and [facility].  Pt's nurse, Diane, has been notified, and agrees to call report to [phone].  Pt is to be transported via [employer].  Doylene Canninghomas Tafari Humiston, MA Triage Specialist [phone]

## 2016-08-19 NOTE — ED Notes (Signed)
Pt became tearful when she called her female friend and he told her that he did not know if he could visit or not.

## 2016-08-19 NOTE — Progress Notes (Signed)
Patient ID: Lisa Fitzpatrick, female   DOB: 08/23/1996, 20 y.o.   MRN: [id number]  Lisa Fitzpatrick is a 20 year old caucasian female [facility] after making suicidal statements to her mother and having aggressive behavior. Her suicide plan was to run her car off a cliff. Patient reports she was committed by her mother. Patient reports that the only reason she is here and the admits she has had to the child/adolescent unit in the past is because of her mother. She has a past medical history of anxiety and Bipolar disorder. Patient states that she does not agree with her Bipolar disorder diagnosis. Patient is guarded and tearful during admission process. She admits to making suicidal statements but denies SI at time of admission. She also denies HI and A/V hallucinations. She denies any pain or discomfort. She reports her goal is to discharge. She is cooperative although irritable. She was oriented to the unit, given hygiene products, and dinner. She is a high fall risk due to reporting she "faints" frequently. Q15 minute safety checks initiated and are maintained.

## 2016-08-19 NOTE — Tx Team (Signed)
Initial Treatment Plan 08/19/2016 6:00 PM Lisa Glazeryn M Tonelli [id number]    PATIENT STRESSORS: Financial difficulties Marital or family conflict Medication change or noncompliance Occupational concerns Substance abuse   PATIENT STRENGTHS: Average or above average intelligence Capable of independent living Licensed conveyancerCommunication skills Financial means Work skills   PATIENT IDENTIFIED PROBLEMS: "My family is my problem"  "My life sucks"  Depression  Anxiety  Suicidal Ideation             DISCHARGE CRITERIA:  Improved stabilization in mood, thinking, and/or behavior Motivation to continue treatment in a less acute level of care Reduction of life-threatening or endangering symptoms to within safe limits  PRELIMINARY DISCHARGE PLAN: Outpatient therapy Return to previous living arrangement  PATIENT/FAMILY INVOLVEMENT: This treatment plan has been presented to and reviewed with the patient, Lisa Fitzpatrick.  The patient and family have been given the opportunity to ask questions and make suggestions.  Cranford MonBeaudry, Maevyn Riordan Evans, RN 08/19/2016, 6:00 PM

## 2016-08-19 NOTE — Progress Notes (Signed)
Adult Psychoeducational Group Note  Date:  08/19/2016 Time:  9:39 PM  Group Topic/Focus:  Wrap-Up Group:   The focus of this group is to help patients review their daily goal of treatment and discuss progress on daily workbooks.   Participation Level:  Active  Participation Quality:  Appropriate  Affect:  Appropriate  Cognitive:  Alert  Insight: Appropriate  Engagement in Group:  Engaged  Modes of Intervention:  Discussion  Additional Comments:  Patient states, my day was really weird. [facility]".  Lisa Fitzpatrick 08/19/2016, 9:39 PM

## 2016-08-19 NOTE — ED Notes (Signed)
[facility]. All belongings returned to pt who signed for same.

## 2016-08-19 NOTE — Progress Notes (Signed)
08/19/16 1452:  Pt was with visitor and did not participate in any activities.  Caroll RancherMarjette Ethan Kasperski, LRT/CTRS

## 2016-08-19 NOTE — ED Notes (Signed)
Called report to [place] and called [employer] to transport pt.

## 2016-08-19 NOTE — ED Notes (Signed)
Affect flat, mood depressed. Pt has stayed in bed all morning, calm and cooperative. She did not want her mother to visit and did not want for her mother to receive information about her care and said, "My mother is a Sales promotion account executiveliar."

## 2016-08-20 DIAGNOSIS — F332 Major depressive disorder, recurrent severe without psychotic features: Secondary | ICD-10-CM

## 2016-08-20 MED ORDER — ARIPIPRAZOLE 2 MG PO TABS
2.0000 mg | ORAL_TABLET | Freq: Every day | ORAL | Status: DC
  Administered 2016-08-20 – 2016-08-24 (×5): 2 mg via ORAL
  Filled 2016-08-20 (×7): qty 1

## 2016-08-20 MED ORDER — VENLAFAXINE HCL ER 75 MG PO CP24
75.0000 mg | ORAL_CAPSULE | Freq: Every day | ORAL | Status: DC
  Administered 2016-08-21 – 2016-08-24 (×4): 75 mg via ORAL
  Filled 2016-08-20 (×6): qty 1

## 2016-08-20 MED ORDER — LORAZEPAM 0.5 MG PO TABS
0.5000 mg | ORAL_TABLET | Freq: Four times a day (QID) | ORAL | Status: DC | PRN
  Administered 2016-08-21 – 2016-08-23 (×4): 0.5 mg via ORAL
  Filled 2016-08-20 (×4): qty 1

## 2016-08-20 MED ORDER — CLOTRIMAZOLE 1 % EX CREA
TOPICAL_CREAM | Freq: Two times a day (BID) | CUTANEOUS | Status: DC
  Administered 2016-08-20 – 2016-08-23 (×3): via TOPICAL
  Filled 2016-08-20: qty 15
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
## 2016-08-20 NOTE — H&P (Signed)
Psychiatric Admission Assessment Adult  Patient Identification: Lisa Fitzpatrick MRN:  [id number] Date of Evaluation:  08/20/2016 Chief Complaint:  " I am here involuntarily because I confided in my mother that I want to die " Principal Diagnosis:  Major Depression, Recurrent, no Psychotic Features, Cannabis Dependence Diagnosis:   Patient Active Problem List   Diagnosis Date Noted  . Bipolar 1 disorder, depressed ([place]) [F31.9] 08/19/2016  . Bipolar affective disorder, current episode depressed ([place]) [F31.30] 08/19/2016   History of Present Illness: 20 year old female, reports depression and suicidal ideations . States she has been thinking of driving " to the [place] and then drive myself off a cliff ".  Reports neuro-vegetative symptoms as below,denies psychotic symptoms . Reports a series of stressors recently, including being kicked out of her father's home several months ago " because they drug tested me and told me I was positive for cocaine, even though I was not", moving from [place] to [place], living with a roommate who " was using drugs ", recent homelessness due to difficulty to afford rent ,  recent severe UTI, recent break up with boyfriend. Also states she has been living with mother recently, but that mother kicked her out of the house earlier this week . Of note, patient reports history of Cannabis Dependence, smokes daily .  Associated Signs/Symptoms: Depression Symptoms:  depressed mood, recurrent thoughts of death, suicidal thoughts with specific plan, loss of energy/fatigue, decreased appetite, weight stable  Describes low sense of self esteem (Hypo) Manic Symptoms:   Denies  Anxiety Symptoms:  Describes panic attacks, states she worries excessively, mainly when driving, denies agoraphobia  Psychotic Symptoms: denies  PTSD Symptoms: Describes history of abuse but denies  PTSD symptoms at this time Total Time spent with patient: 45 minutes  Past Psychiatric History: two prior  psychiatric admissions as a teenager for depression and suicide ideations, history of suicide attempt by hanging self when she was about 10 years ago. History of self cutting , stopped 2-3 years ago. Denies any history of psychosis. Patient states she has been diagnosed with Bipolar Disorder in the past, but states " I don't think I have ever been manic, I just get depressed ". States " I have been diagnosed because my mother told doctors that I was when I was a teenager but really I am not ".    Is the patient at risk to self? Yes.    Has the patient been a risk to self in the past 6 months? Yes.    Has the patient been a risk to self within the distant past? Yes.    Is the patient a risk to others? No.  Has the patient been a risk to others in the past 6 months? No.  Has the patient been a risk to others within the distant past? No.   Prior Inpatient Therapy:  as above  Prior Outpatient Therapy:   sees Dr. Toy Care for psychiatric medications, but has not seen in several months   Alcohol Screening: 1. How often do you have a drink containing alcohol?: Monthly or less 2. How many drinks containing alcohol do you have on a typical day when you are drinking?: 1 or 2 3. How often do you have six or more drinks on one occasion?: Never Preliminary Score: 0 9. Have you or someone else been injured as a result of your drinking?: No 10. Has a relative or friend or a doctor or another health worker been concerned about  your drinking or suggested you cut down?: No Alcohol Use Disorder Identification Test Final Score (AUDIT): 1 Brief Intervention: AUDIT score less than 7 or less-screening does not suggest unhealthy drinking-brief intervention not indicated Substance Abuse History in the last 12 months:  Denies alcohol abuse, states she does drink occasionally, and has blacked out in the past, smokes cannabis daily, denies other drug abuse, denies IVDA  Consequences of Substance Abuse: Occasional blackouts  from drinking, but denies any pattern of alcohol abuse, relationship stressors  Previous Psychotropic Medications: states she was on propranolol for anxiety. Psychological Evaluations:  No  Past Medical History: denies, miscarriage 1-2 months ago  Past Medical History:  Diagnosis Date  . Anxiety   . Bipolar disorder [facility])     Past Surgical History:  Procedure Laterality Date  . 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 History  Alcohol Use  . Yes    Comment: occ     History  Drug Use  . Frequency: 7.0 times per week  . Types: Marijuana    Comment: daily user     Additional Social History:  Allergies:   Allergies  Allergen Reactions  . Codeine Hives and Rash    Scratchy throat    Lab Results:  Results for orders placed or performed during the hospital encounter of 08/18/16 (from the past 48 hour(s))  Rapid urine drug screen (hospital performed)     Status: Abnormal   Collection Time: 08/18/16  7:15 PM  Result Value Ref Range   Opiates NONE DETECTED NONE DETECTED   Cocaine NONE DETECTED NONE DETECTED   Benzodiazepines NONE DETECTED NONE DETECTED   Amphetamines NONE DETECTED NONE DETECTED   Tetrahydrocannabinol POSITIVE (A) NONE DETECTED   Barbiturates NONE DETECTED NONE DETECTED    Comment:        DRUG SCREEN FOR MEDICAL PURPOSES ONLY.  IF CONFIRMATION IS NEEDED FOR ANY PURPOSE, NOTIFY LAB WITHIN 5 DAYS.        LOWEST DETECTABLE LIMITS FOR URINE DRUG SCREEN Drug Class       Cutoff (ng/mL) Amphetamine      1000 Barbiturate      200 Benzodiazepine   193 Tricyclics       790 Opiates           300 Cocaine          300 THC              50   Comprehensive metabolic panel     Status: Abnormal   Collection Time: 08/18/16  7:43 PM  Result Value Ref Range   Sodium 140 135 - 145 mmol/L   Potassium 3.7 3.5 - 5.1 mmol/L   Chloride 109 101 - 111 mmol/L   CO2 27 22 - 32 mmol/L   Glucose, Bld 97 65 - 99 mg/dL   BUN 16 6 - 20 mg/dL   Creatinine, Ser 0.75 0.44 - 1.00 mg/dL   Calcium 9.3 8.9 - 10.3 mg/dL   Total Protein 7.3 6.5 - 8.1 g/dL   Albumin 4.2 3.5 - 5.0 g/dL   AST 16 15 -  41 U/L   ALT 11 (L) 14 - 54 U/L   Alkaline Phosphatase 75 38 - 126 U/L   Total Bilirubin 0.4 0.3 - 1.2 mg/dL   GFR calc non Af Amer >60 >60 mL/min   GFR calc Af Amer >60 >60 mL/min    Comment: (NOTE) The eGFR has been calculated using the CKD EPI equation. This calculation has not been validated in all clinical situations. eGFR's persistently <60 mL/min signify possible Chronic Kidney Disease.    Anion gap 4 (L) 5 - 15  Ethanol     Status: None   Collection Time: 08/18/16  7:43 PM  Result Value Ref Range   Alcohol, Ethyl (B) <5 <5 mg/dL    Comment:        LOWEST DETECTABLE LIMIT FOR SERUM ALCOHOL IS 5 mg/dL FOR MEDICAL PURPOSES ONLY   Salicylate level     Status: None   Collection Time: 08/18/16  7:43 PM  Result Value Ref Range   Salicylate Lvl <4.2 2.8 - 30.0 mg/dL  Acetaminophen level     Status: Abnormal   Collection Time: 08/18/16  7:43 PM  Result Value Ref Range   Acetaminophen (Tylenol), Serum <10 (L) 10 - 30 ug/mL    Comment:        THERAPEUTIC CONCENTRATIONS VARY SIGNIFICANTLY. A RANGE OF 10-30 ug/mL MAY BE AN EFFECTIVE CONCENTRATION FOR MANY PATIENTS. HOWEVER, SOME ARE BEST TREATED AT CONCENTRATIONS OUTSIDE THIS RANGE. ACETAMINOPHEN CONCENTRATIONS >150 ug/mL AT 4 HOURS AFTER INGESTION AND >50 ug/mL AT 12 HOURS AFTER INGESTION ARE OFTEN ASSOCIATED WITH TOXIC REACTIONS.   cbc     Status: None   Collection Time: 08/18/16  7:43 PM  Result Value Ref Range   WBC 7.1  4.0 - 10.5 K/uL   RBC 4.78 3.87 - 5.11 MIL/uL   Hemoglobin 13.1 12.0 - 15.0 g/dL   HCT 39.5 36.0 - 46.0 %   MCV 82.6 78.0 - 100.0 fL   MCH 27.4 26.0 - 34.0 pg   MCHC 33.2 30.0 - 36.0 g/dL   RDW 13.7 11.5 - 15.5 %   Platelets 214 150 - 400 K/uL  I-Stat beta hCG blood, ED     Status: None   Collection Time: 08/18/16  7:52 PM  Result Value Ref Range   I-stat hCG, quantitative <5.0 <5 mIU/mL   Comment 3            Comment:   GEST. AGE      CONC.  (mIU/mL)   <=1 WEEK        5 - 50     2 WEEKS       50 - 500     3 WEEKS       100 - 10,000     4 WEEKS     1,000 - 30,000        FEMALE AND NON-PREGNANT FEMALE:     LESS THAN 5 mIU/mL     Blood Alcohol level:  Lab Results  Component Value Date   ETH <5 08/18/2016   [facility]  05/19/2010    <5        LOWEST DETECTABLE LIMIT FOR SERUM ALCOHOL IS 5 mg/dL FOR MEDICAL PURPOSES ONLY    Metabolic Disorder Labs:   No results found for: PROLACTIN Lab Results  Component Value Date   CHOL  05/21/2010    127        ATP III CLASSIFICATION:  <200     mg/dL   Desirable  200-239  mg/dL   Borderline High  >=240    mg/dL   High          TRIG 28 05/21/2010   HDL 55 05/21/2010   CHOLHDL 2.3 05/21/2010   VLDL 6 05/21/2010   LDLCALC  05/21/2010    66        Total Cholesterol/HDL:CHD Risk Coronary Heart Disease Risk Table                     Men   Women  1/2 Average Risk   3.4   3.3  Average Risk       5.0   4.4  2 X Average Risk   9.6   7.1  3 X Average Risk  23.4   11.0        Use the calculated Patient Ratio above and the CHD Risk Table to determine the patient's CHD Risk.        ATP III CLASSIFICATION (LDL):  <100     mg/dL   Optimal  100-129  mg/dL   Near or Above                    Optimal  130-159  mg/dL   Borderline  160-189  mg/dL   High  >190     mg/dL   Very High   LDLCALC  10/18/2009    72        Total Cholesterol/HDL:CHD Risk Coronary Heart Disease Risk Table                     Men   Women  1/2 Average Risk   3.4    3.3  Average Risk       5.0   4.4  2 X Average Risk   9.6   7.1  3 X Average Risk  23.4   11.0        Use the calculated Patient Ratio above and the CHD Risk Table to determine the patient's CHD Risk.        ATP III CLASSIFICATION (LDL):  <100     mg/dL   Optimal  100-129  mg/dL   Near or Above                    Optimal  130-159  mg/dL   Borderline  160-189  mg/dL   High  >190     mg/dL   Very High    Current Medications: Current Facility-Administered Medications  Medication Dose Route Frequency Provider Last Rate Last Dose  . acetaminophen (TYLENOL) tablet 650 mg  650 mg Oral Q4H PRN Patrecia Pour, NP      . alum & mag hydroxide-simeth (MAALOX/MYLANTA) 200-200-20 MG/5ML suspension 30 mL  30 mL Oral PRN Patrecia Pour, NP      . carbamazepine (TEGRETOL) tablet 200 mg  200 mg Oral BID PC Patrecia Pour, NP   200 mg at 08/20/16 0835  . dicyclomine (BENTYL) tablet 20 mg  20 mg Oral BID PRN Patrecia Pour, NP      . ibuprofen (ADVIL,MOTRIN) tablet 600 mg  600 mg Oral Q8H PRN Patrecia Pour, NP      . LORazepam (ATIVAN) tablet 2 mg  2 mg Oral Q6H PRN Patrecia Pour, NP   2 mg at 08/19/16 1815  . magnesium hydroxide (MILK OF MAGNESIA) suspension 30 mL  30 mL Oral Daily PRN Patrecia Pour, NP      .  ondansetron (ZOFRAN) tablet 4 mg  4 mg Oral Q8H PRN Patrecia Pour, NP      . traZODone (DESYREL) tablet 50 mg  50 mg Oral QHS PRN Patrecia Pour, NP       PTA Medications: Prescriptions Prior to Admission  Medication Sig Dispense Refill Last Dose  . acetaminophen (TYLENOL) 500 MG tablet Take 1,000 mg by mouth every 6 (six) hours as needed for fever.    Past Week at Unknown time  . amphetamine-dextroamphetamine (ADDERALL) 15 MG tablet Take 15 mg by mouth 2 (two) times daily as needed.   unknown  . clotrimazole (LOTRIMIN) 1 % cream Apply 1 application topically 2 (two) times daily.   08/18/2016 at Unknown time  . CRANBERRY PO Take 1 tablet by mouth daily.    08/17/2016 at Unknown time  .  dicyclomine (BENTYL) 20 MG tablet Take 1 tablet (20 mg total) by mouth 2 (two) times daily as needed (abdominal pain). 20 tablet 0 Past Month at Unknown time  . fluconazole (DIFLUCAN) 150 MG tablet Take 150 mg by mouth every 3 (three) days.   08/15/2016  . ondansetron (ZOFRAN ODT) 4 MG disintegrating tablet Take 1 tablet (4 mg total) by mouth every 8 (eight) hours as needed for nausea or vomiting. 20 tablet 0 Past Week at Unknown time  . traMADol (ULTRAM) 50 MG tablet Take 1 tablet (50 mg total) by mouth every 6 (six) hours as needed (pain). 10 tablet 0 2 weeks at Unknown time  . cephALEXin (KEFLEX) 500 MG capsule Take 1 capsule (500 mg total) by mouth 4 (four) times daily. (Patient not taking: Reported on 08/18/2016) 40 capsule 0 Not Taking at Unknown time    Musculoskeletal: Strength & Muscle Tone: within normal limits Gait & Station: normal Patient leans: N/A  Psychiatric Specialty Exam: Physical Exam  ROS  Blood pressure 98/70, pulse 77, temperature 98 F (36.7 C), temperature source Oral, resp. rate 16, height 5' 5.5" (1.664 m), weight 160 lb (72.6 kg), last menstrual period 08/16/2016, SpO2 100 %.Body mass index is 26.22 kg/m.  General Appearance: Fairly Groomed  Eye Contact:  Good  Speech:  Normal Rate  Volume:  Normal  Mood:  Depressed  Affect:  Constricted and  intermittently tearful   Thought Process:  Linear  Orientation:  Full (Time, Place, and Person)  Thought Content:  denies hallucinations, no delusions , not internally preoccupied   Suicidal Thoughts:  No denies any suicidal or self injurious ideations , contracts for safety on the unit   Homicidal Thoughts:  No denies any homicidal ideations or violent ideations   Memory:  recent and remote grossly intact   Judgement:  Fair  Insight:  Fair  Psychomotor Activity:  Normal  Concentration:  Concentration: Good and Attention Span: Good  Recall:  Good  Fund of Knowledge:  Good  Language:  Good  Akathisia:  Negative   Handed:  Right  AIMS (if indicated):     Assets:  Communication Skills Resilience  ADL's:  Intact  Cognition:  WNL  Sleep:  Number of Hours: 6.5       Treatment Plan Summary: Daily contact with patient to assess and evaluate symptoms and progress in treatment, Medication management, Plan inpatient admission  and medications as below   Observation Level/Precautions:  15 minute checks  Laboratory:  as needed - TSH   Psychotherapy:  Milieu, support   Medications:  We discussed options- as noted, patient denies history of mania , hypomania, and reports  recent unplanned pregnancy- will D/C Tegretol. We discussed antidepressant options, agrees to Effexor XR - start 75 mgrs QDAY and Abilify 2 mgrs QDAY  Ativan 0.5 Q 6 hours PRN for anxiety as needed . Patient does not want Trazodone PRNs  Consultations:  As needed   Discharge Concerns: -   Estimated LOS: 5-6 days   Other:     I certify that inpatient services furnished can reasonably be expected to improve the patient's condition.    Neita Garnet, MD 8/23/201711:16 AM

## 2016-08-20 NOTE — [facility] Suicide Risk Assessment (Signed)
[facility] INPATIENT:  Family/Significant Other Suicide Prevention Education  Suicide Prevention Education:  Patient Refusal for Family/Significant Other Suicide Prevention Education: The patient Lisa Fitzpatrick has refused to provide written consent for family/significant other to be provided Family/Significant Other Suicide Prevention Education during admission and/or prior to discharge.  Physician notified.  Sallee Langenne C Adlean Hardeman 08/20/2016, 11:17 AM

## 2016-08-20 NOTE — Progress Notes (Signed)
D: Pt presents with flat affect and depressed mood. Pt appears sad during the shift and noted to have crying spells. Pt states that she becomes tearful when thinking about her situation. Pt made it clear to writer this morning that she's not returning back to her mother's house and will continue to live in her car. Pt minimizing her symptoms and is guarded. Pt denies suicidal thoughts and verbally contracts for safety. Pt minimizing her depression and rates it 0/10. Pt reports fungal infection to her breasts. Velna HatchetSheila May, NP., assessed pt and pt started on Clotrimazole at pt request for irritation. No redness noted.  A: Medications reviewed with pt. Medications administered as ordered per MD. Verbal support provided. Pt encouraged to attend groups. 15 minute checks performed for safety. R: Pt receptive to tx.
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
## 2016-08-20 NOTE — ED Provider Notes (Signed)
[facility]Y [facility]PT [facility] Provider Note   CSN: [id number] Arrival date & time: 08/18/16  1858     History   Chief Complaint Chief Complaint  Patient presents with  . Suicidal    HPI Lisa Fitzpatrick is a 20 y.o. female.  HPI Patient presents to the emergency department with suicidal ideation.  The patient states that she has been having increasing suicidal thoughts over the last week.  Patient states she has attempted suicide in past.  She states she did not attempt suicide during this recent bout patient states nothing seems make her condition better or worse.  Patient denies any hallucinations, nausea, vomiting, abdominal pain, chest pain, shortness breath, weakness, dizziness, headache, blurred vision, back pain, neck pain, fever or syncope Past Medical History:  Diagnosis Date  . Anxiety   . Bipolar disorder [facility](HCC)     Patient Active Problem List   Diagnosis Date Noted  . Bipolar 1 disorder, depressed (HCC) 08/19/2016  . Bipolar affective disorder, current episode depressed (HCC) 08/19/2016    Past Surgical History:  Procedure Laterality Date  . WISDOM TOOTH EXTRACTION      OB History    No data available       Home Medications    Prior to Admission medications   Medication Sig Start Date End Date Taking? Authorizing Provider  acetaminophen (TYLENOL) 500 MG tablet Take 1,000 mg by mouth every 6 (six) hours as needed for fever.    Yes Historical Provider, MD  amphetamine-dextroamphetamine (ADDERALL) 15 MG tablet Take 15 mg by mouth 2 (two) times daily as needed.   Yes Historical Provider, MD  clotrimazole (LOTRIMIN) 1 % cream Apply 1 application topically 2 (two) times daily.   Yes Historical Provider, MD  CRANBERRY PO Take 1 tablet by mouth daily.    Yes Historical Provider, MD  dicyclomine (BENTYL) 20 MG tablet Take 1 tablet (20 mg total) by mouth 2 (two) times daily as needed (abdominal pain). 07/22/16  Yes Ace GinsSerena Y Sam, PA-C  fluconazole (DIFLUCAN) 150 MG tablet  Take 150 mg by mouth every 3 (three) days.   Yes Historical Provider, MD  FLUCONAZOLE PO Take 1 tablet by mouth every 3 (three) days.   Yes Historical Provider, MD  ondansetron (ZOFRAN ODT) 4 MG disintegrating tablet Take 1 tablet (4 mg total) by mouth every 8 (eight) hours as needed for nausea or vomiting. 07/22/16  Yes Ace GinsSerena Y Sam, PA-C  traMADol (ULTRAM) 50 MG tablet Take 1 tablet (50 mg total) by mouth every 6 (six) hours as needed (pain). 07/22/16  Yes Ace GinsSerena Y Sam, PA-C  cephALEXin (KEFLEX) 500 MG capsule Take 1 capsule (500 mg total) by mouth 4 (four) times daily. Patient not taking: Reported on 08/18/2016 07/17/16   Lorre NickAnthony Allen, MD    Family History History reviewed. No pertinent family history.  Social History Social History  Substance Use Topics  . Smoking status: Current Some Day Smoker    Years: 4.00  . Smokeless tobacco: Never Used     Comment: Hookah   . Alcohol use Yes     Comment: occ     Allergies   Codeine   Review of Systems Review of Systems  All other systems negative except as documented in the HPI. All pertinent positives and negatives as reviewed in the HPI. Physical Exam Updated Vital Signs BP (!) 107/54   Pulse 87   Temp 98.1 F (36.7 C)   Resp 18   Ht 5\' 5"  (1.651 m)  Wt 70.8 kg   LMP 08/16/2016 (Exact Date)   SpO2 100%   BMI 25.96 kg/m   Physical Exam  Constitutional: She is oriented to person, place, and time. She appears well-developed and well-nourished. No distress.  HENT:  Head: Normocephalic and atraumatic.  Mouth/Throat: Oropharynx is clear and moist.  Eyes: Pupils are equal, round, and reactive to light.  Neck: Normal range of motion. Neck supple.  Cardiovascular: Normal rate, regular rhythm and normal heart sounds.  Exam reveals no gallop and no friction rub.   No murmur heard. Pulmonary/Chest: Effort normal and breath sounds normal. No respiratory distress. She has no wheezes.  Abdominal: Soft. Bowel sounds are normal. She  exhibits no distension. There is no tenderness.  Neurological: She is alert and oriented to person, place, and time. She exhibits normal muscle tone. Coordination normal.  Skin: Skin is warm and dry. No rash noted. No erythema.  Psychiatric: Her behavior is normal. Her mood appears not anxious. Her affect is not angry and not inappropriate. Her speech is not rapid and/or pressured. Thought content is not paranoid and not delusional. She exhibits a depressed mood. She expresses suicidal ideation. She expresses no homicidal ideation. She expresses suicidal plans. She expresses no homicidal plans.  Nursing note and vitals reviewed.    ED Treatments / Results  Labs (all labs ordered are listed, but only abnormal results are displayed) Labs Reviewed  COMPREHENSIVE METABOLIC PANEL - Abnormal; Notable for the following:       Result Value   ALT 11 (*)    Anion gap 4 (*)    All other components within normal limits  ACETAMINOPHEN LEVEL - Abnormal; Notable for the following:    Acetaminophen (Tylenol), Serum <10 (*)    All other components within normal limits  URINE RAPID DRUG SCREEN, HOSP PERFORMED - Abnormal; Notable for the following:    Tetrahydrocannabinol POSITIVE (*)    All other components within normal limits  ETHANOL  SALICYLATE LEVEL  CBC  I-STAT BETA HCG BLOOD, ED ([facility], [facility], [facility] ONLY)    EKG  EKG Interpretation None       Radiology No results found.  Procedures Procedures (including critical care time)  Medications Ordered in ED Medications - No data to display   Initial Impression / Assessment and Plan / ED Course  I have reviewed the triage vital signs and the nursing notes.  Pertinent labs & imaging results that were available during my care of the patient were reviewed by me and considered in my medical decision making (see chart for details).  Clinical Course    The patient will need TTS assessment for suicidal ideation and plan  Final Clinical  Impressions(s) / ED Diagnoses   Final diagnoses:  Bipolar 1 disorder, depressed [facility](HCC)    New Prescriptions Discharge Medication List as of 08/19/2016  5:20 PM       Charlestine Nighthristopher Kataya Guimont, PA-C 08/20/16 0135    Courteney Randall AnLyn Mackuen, MD 08/23/16 [id number]

## 2016-08-20 NOTE — Progress Notes (Signed)
Recreation Therapy Notes  Date: 08/20/16 Time: 0930 Location: 300 Hall Group Room  Group Topic: Stress Management  Goal Area(s) Addresses:  Patient will verbalize importance of using healthy stress management.  Patient will identify positive emotions associated with healthy stress management.   Intervention: Stress Management  Activity :  Floating on a Cloud.  LRT introduced the technique of guided imagery to the patients.  Patients were asked to follow along as LRT read the script to participate in the activity.  Education: Stress Management, Discharge Planning.   Education Outcome: Acknowledges edcuation/In group clarification offered/Needs additional education  Clinical Observations/Feedback:  Pt did not attend group.       Caroll RancherMarjette Trystyn Dolley, LRT/CTRS   Caroll RancherLindsay, Vidhi Delellis A 08/20/2016 12:18 PM

## 2016-08-20 NOTE — Clinical Social Work Note (Signed)
[facility].  Santa GeneraAnne Kairah Leoni, LCSW Lead Clinical Social Worker Phone:  [phone]

## 2016-08-20 NOTE — [facility] Group Notes (Signed)
[facility] LCSW Group Therapy 08/20/2016 1:15 PM  Type of Therapy: Group Therapy- Emotion Regulation  Participation Level: Active   Participation Quality:  Appropriate  Affect: Flat  Cognitive: Alert and Oriented   Insight:  Developing/Improving  Engagement in Therapy: Developing/Improving and Engaged   Modes of Intervention: Clarification, Confrontation, Discussion, Education, Exploration, Limit-setting, Orientation, Problem-solving, Rapport Building, Dance movement psychotherapisteality Testing, Socialization and Support  Summary of Progress/Problems: The topic for group today was emotional regulation. This group focused on both positive and negative emotion identification and allowed group members to process ways to identify feelings, regulate negative emotions, and find healthy ways to manage internal/external emotions. Group members were asked to reflect on a time when their reaction to an emotion led to a negative outcome and explored how alternative responses using emotion regulation would have benefited them. Group members were also asked to discuss a time when emotion regulation was utilized when a negative emotion was experienced. Pt discussed that anxiety and sadness are difficult emotions for her to regulate. She was able to identify symptoms and warning signs of both and relate to the experiences of other peers.    Lisa CordialLauren Carter, LCSWA 08/20/2016 5:33 PM

## 2016-08-20 NOTE — Tx Team (Signed)
Interdisciplinary Treatment Plan Update (Adult) Date: 08/20/2016   Date: 08/20/2016 5:32 PM  Progress in Treatment:  Attending groups: Yes  Participating in groups: Yes  Taking medication as prescribed: Yes  Tolerating medication: Yes  Family/Significant othe contact made: No, CSW assessing for appropriate contact Patient understands diagnosis: Yes AEB seeking help with depression Discussing patient identified problems/goals with staff: Yes  Medical problems stabilized or resolved: Yes  Denies suicidal/homicidal ideation: Yes Patient has not harmed self or Others: Yes   New problem(s) identified: None identified at this time.   Discharge Plan or Barriers: CSW will assess for appropriate discharge plan and relevant barriers.   Additional comments:  Patient and CSW reviewed pt's identified goals and treatment plan. Patient verbalized understanding and agreed to treatment plan.   Reason for Continuation of Hospitalization:  Anxiety Depression Medication stabilization Suicidal ideation  Estimated length of stay: 3-5 days  Review of initial/current patient goals per problem list:   1.  Goal(s): Patient will participate in aftercare plan  Met:  No  Target date: 3-5 days from date of admission   As evidenced by: Patient will participate within aftercare plan AEB aftercare provider and housing plan at discharge being identified.  08/20/16: CSW to work with Pt to assess for appropriate discharge plan and faciliate appointments and referrals as needed prior to d/c.  2.  Goal (s): Patient will exhibit decreased depressive symptoms and suicidal ideations.  Met:  No  Target date: 3-5 days from date of admission   As evidenced by: Patient will utilize self rating of depression at 3 or below and demonstrate decreased signs of depression or be deemed stable for discharge by MD. 08/20/16: Pt was admitted with symptoms of depression, rating 10/10. Pt continues to present with flat affect  and depressive symptoms.  Pt will demonstrate decreased symptoms of depression and rate depression at 3/10 or lower prior to discharge.  3.  Goal(s): Patient will demonstrate decreased signs and symptoms of anxiety.  Met:  No  Target date: 3-5 days from date of admission   As evidenced by: Patient will utilize self rating of anxiety at 3 or below and demonstrated decreased signs of anxiety, or be deemed stable for discharge by MD 08/20/16: Pt was admitted with increased levels of anxiety and is currently rating those symptoms highly. Pt will demonstrated decreased symptoms of anxiety and rate it at 3/10 prior to d/c.   Attendees:  Patient:    Family:    Physician: Dr. Parke Poisson, MD  08/20/2016 5:32 PM  Nursing: Darrol Angel, RN 08/20/2016 5:32 PM  Clinical Social Worker Peri Maris, [place] 08/20/2016 5:32 PM  Other: Tilden Fossa, LCSWA 08/20/2016 5:32 PM  Clinical: Lars Pinks, RN Case manager  08/20/2016 5:32 PM  Other:  08/20/2016 5:32 PM  Other:     Peri Maris, [place] Social Work [phone]

## 2016-08-20 NOTE — [facility] Suicide Risk Assessment (Signed)
[facility]   Nursing information obtained from:   patient and chart  Demographic factors:   20 year old single female, employed  Current Mental Status:   see below Loss Factors:   relationship stressors with family, recent break up, recent miscarriage  Historical Factors:   depression, substance abuse  Risk Reduction Factors:   resilience   Total Time spent with patient: 45 minutes Principal Problem:  Major Depression,Cannabis Dependence Diagnosis:   Patient Active Problem List   Diagnosis Date Noted  . Bipolar 1 disorder, depressed (HCC) [F31.9] 08/19/2016  . Bipolar affective disorder, current episode depressed (HCC) [F31.30] 08/19/2016     Continued Clinical Symptoms:  Alcohol Use Disorder Identification Test Final Score (AUDIT): 1 The "Alcohol Use Disorders Identification Test", Guidelines for Use in Primary Care, Second Edition.  World Science writerHealth Organization [facility](WHO). Score between 0-7:  no or low risk or alcohol related problems. Score between 8-15:  moderate risk of alcohol related problems. Score between 16-19:  high risk of alcohol related problems. Score 20 or above:  warrants further diagnostic evaluation for alcohol dependence and treatment.   CLINICAL FACTORS:  20 year old female, history of depression, substance abuse ( cannabis is substance of choice ) , reports multiple recent stressors, presents with symptoms of depression and suicidal ideations, no psychotic symptoms.     Psychiatric Specialty Exam: Physical Exam  ROS  Blood pressure 98/70, pulse 77, temperature 98 F (36.7 C), temperature source Oral, resp. rate 16, height 5' 5.5" (1.664 m), weight 160 lb (72.6 kg), last menstrual period 08/16/2016, SpO2 100 %.Body mass index is 26.22 kg/m.   see admit note MSE     COGNITIVE FEATURES THAT CONTRIBUTE TO RISK:  Closed-mindedness and Loss of executive function    SUICIDE RISK:   Moderate:  Frequent suicidal ideation with limited  intensity, and duration, some specificity in terms of plans, no associated intent, good self-control, limited dysphoria/symptomatology, some risk factors present, and identifiable protective factors, including available and accessible social support.   PLAN OF CARE: Patient will be admitted to inpatient psychiatric unit for stabilization and safety. Will provide and encourage milieu participation. Provide medication management and maked adjustments as needed.  Will follow daily.    I certify that inpatient services furnished can reasonably be expected to improve the patient's condition.  Nehemiah MassedOBOS, Carnell Casamento, MD 08/20/2016, 12:48 PM

## 2016-08-20 NOTE — Progress Notes (Signed)
Adult Psychoeducational Group Note  Date:  08/20/2016 Time:  9:31 PM  Group Topic/Focus:  Wrap-Up Group:   The focus of this group is to help patients review their daily goal of treatment and discuss progress on daily workbooks.   Participation Level:  Did Not Attend  Participation Quality:  Did not attend  Affect:  Did not attend  Cognitive:  Did not attend  Insight: None  Engagement in Group:  Did not attend  Modes of Intervention:  Did not attend  Additional Comments:  Patient did not attend wrap up group tonight.  Deletha Jaffee L Forever Arechiga 08/20/2016, 9:31 PM
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
## 2016-08-21 LAB — TSH: TSH: 0.809 u[IU]/mL (ref 0.350–4.500)

## 2016-08-21 NOTE — Progress Notes (Signed)
Adult Psychoeducational Group Note  Date:  08/21/2016 Time:  10:07 PM  Group Topic/Focus:  Wrap-Up Group:   The focus of this group is to help patients review their daily goal of treatment and discuss progress on daily workbooks.   Participation Level:  Active  Participation Quality:  Appropriate  Affect:  Appropriate  Cognitive:  Alert  Insight: Appropriate  Engagement in Group:  Engaged  Modes of Intervention:  Discussion  Additional Comments:  Patient states, "I had an alright day". Patient's goal for today is "to find out a discharge plan". [name] L Bodin Gorka 08/21/2016, 10:07 PM

## 2016-08-21 NOTE — Progress Notes (Signed)
Patient ID: Lisa Fitzpatrick, female   DOB: 12/05/1996, 20 y.o.   MRN: [id number] D: Patient continues to report depressive symptoms.  Her main trigger is her mother and does not plan to return to her mother's home.  Patient has been currently residing in her car.  She denies any thoughts of self harm.  She is sleeping good.  She continues to report poor appetite.  She minimizes her depressive symptoms, rating them at a 0.  She rates her hopelessness as a 2; anxiety as a 3.  Patient is interacting well with her peers. She is attending some groups on the unit. A: Continue to monitor medication management and MD orders.  Safety checks completed every 15 minutes per protocol. Offer support and encouragement as needed. R: Patient is receptive to staff; her behavior is appropriate.

## 2016-08-21 NOTE — [facility] Group Notes (Signed)
0900 am  Psychosocial Group  Patient did not attend.

## 2016-08-21 NOTE — Progress Notes (Signed)
Pt has been in the dayroom most of the evening.  She reports that her day has been fine.  She denies SI/HI/AVH.  She made no mention of her visit with her boyfriend during our conversation.  She did complain of nausea and anxiety and requested something for each which she was given early in the shift.  She was informed that she did not have any meds scheduled for the night, but to inform staff if she needed any thing else this evening.  Pt was polite and cooperative with Clinical research associatewriter although she seemed cautious at first when Clinical research associatewriter introduced self.  Pt went ot bed shortly after group time and did not make any more requests.  Support and encouragement offered.  Discharge plans are in process.  Safety maintained with q15 minute checks.

## 2016-08-21 NOTE — [facility] Group Notes (Signed)
[facility] 08/21/2016 1:15pm  Type of Therapy: Mental [facility] Association Presentation  Pt did not attend, declined invitation.   Chad CordialLauren Carter, LCSWA 08/21/2016 2:05 PM
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
## 2016-08-22 NOTE — Progress Notes (Signed)
Pt has been quiet and withdrawn this evening.  She reports that her day has been difficult d/t her abdominal pain.  She states that she was given Tylenol right at shift change, but it has not been effective in decreasing her pain level.  She is also still having some nausea, but did not request anything for the nausea at this time.  The providers are aware of her pain issues and no other med changes were made today.  Pt was offered a heat pack which she placed on her abdomen and went back to the dayroom to sit and watch TV,  Pt denies SI/HI/AVH to Clinical research associatewriter.  Pt makes her needs known to staff.  Pt is unsure of her discharge plans at this time, but thinks she will probably stay with her mother until she can get a place of her own.   Support and encouragement offered.  Meds given as ordered and requested.  Discharge plans are in process.  Safety maintained with q15 minute checks.  Pt grateful for the heat pack to her abdomen and said it was helpful.
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
## 2016-08-22 NOTE — [facility] Group Notes (Signed)
[facility] LCSW Group Therapy 08/22/2016 1:15pm  Type of Therapy: Group Therapy- Feelings Around Relapse and Recovery  Participation Level: Active   Participation Quality:  Appropriate  Affect:  Appropriate  Cognitive: Alert and Oriented   Insight:  Developing   Engagement in Therapy: Developing/Improving and Engaged   Modes of Intervention: Clarification, Confrontation, Discussion, Education, Exploration, Limit-setting, Orientation, Problem-solving, Rapport Building, Dance movement psychotherapisteality Testing, Socialization and Support  Summary of Progress/Problems: The topic for today was feelings about relapse. The group discussed what relapse prevention is to them and identified triggers that they are on the path to relapse. Members also processed their feeling towards relapse and were able to relate to common experiences. Group also discussed coping skills that can be used for relapse prevention.     Therapeutic Modalities:   Cognitive Behavioral Therapy Solution-Focused Therapy Assertiveness Training Relapse Prevention Therapy    Lisa SprinklesLauren Carter, LCSWA [phone] 08/22/2016 6:15 PM

## 2016-08-22 NOTE — Progress Notes (Signed)
Lisa Fitzpatrick is seen talking with a female patient. She is cooperative. Quite talkative about her stomach discomfort ...she takes her mes as planned and she completes her daily assessment first thing this morning. A She writes on her assessment she denies SI today and she rates her depression, hopelessness and anxiety " 0/0/1", respectively . She has cvomplained about her nausea and stomach pain twice today, saying " I don't know what I'm going to do about it.Marland Kitchen.Marland Kitchen.I stay nauseated these days and they can't tell me why..." She is given prn zofran with nminimal result. R Safety is in place.

## 2016-08-22 NOTE — Progress Notes (Signed)
Recreation Therapy Notes  Date: 08/22/16 Time: 0945 Location: 300 Hall Group Room  Group Topic: Stress Management  Goal Area(s) Addresses:  Patient will verbalize importance of using healthy stress management.  Patient will identify positive emotions associated with healthy stress management.   Intervention: Stress Management  Activity :  Progressive Muscle Relaxation.  LRT introduced the technique of progressive muscle relaxation to patients.  Patients were to follow along as LRT read script to engaged in the technique.  Education:  Stress Management, Discharge Planning.   Education Outcome: Acknowledges edcuation/In group clarification offered/Needs additional education  Clinical Observations/Feedback: Pt did not attend group.    Whitnie Deleon, LRT/CTRS    Shataria Crist A 08/22/2016 12:49 PM 

## 2016-08-23 MED ORDER — DICYCLOMINE HCL 20 MG PO TABS
20.0000 mg | ORAL_TABLET | Freq: Three times a day (TID) | ORAL | Status: DC | PRN
  Administered 2016-08-23: 20 mg via ORAL
  Filled 2016-08-23: qty 1

## 2016-08-23 MED ORDER — DICYCLOMINE HCL 20 MG PO TABS
20.0000 mg | ORAL_TABLET | Freq: Four times a day (QID) | ORAL | Status: DC | PRN
  Administered 2016-08-24: 20 mg via ORAL
  Filled 2016-08-23: qty 1

## 2016-08-23 NOTE — Progress Notes (Signed)
D: Patient's self inventory sheet: patient has poor sleep, requested and recieved sleep medication.Poor  Appetite, normal energy level, good concentration. Rated depression 2/10, hopeless 3/10, anxiety 5/10. SI/HI/AVH: denies. Physical complaints are abdominal pain and nausea. Goal is "eat healthy (having a lot of nausea)". Plans to work on "try and eat a salad".   A: Medications administered, assessed medication knowledge and education given on medication regimen.  Emotional support and encouragement given patient. R: Denies SI and HI , contracts for safety. Safety maintained with 15 minute checks.

## 2016-08-23 NOTE — [facility] Group Notes (Signed)
[facility] LCSW Group Therapy  08/23/2016 10:00 until 11:00 AM  Type of Therapy:  Group Therapy  Participation Level:  Active  Participation Quality:  Appropriate  Affect:  Appropriate  Cognitive:  Oriented  Insight:  Developing/Improving  Engagement in Therapy:  Developing/Improving  Modes of Intervention:  Discussion, Exploration, Rapport Building, Socialization and Support  Summary of Progress/Problems:  The main focus of today's process group was for the patient to identify ways in which they have in the past sabotaged their own recovery. Motivational Interviewing was utilized to ask the group members what they get out of their self sabotaging behavior(s), and what reasons they may have for wanting to change. The Stages of Change were explained using a handout, and patients identified where they currently are with regard to stages of change. Lisa Fitzpatrick engaged easily in group and shared her struggles with medication compliance. Others offered encouragement which patient responded to positively. Patient also shared major hurdle she has to face upon discharge involving debt repayment which can often lead to her feeling hopeless. Patient reports she is bweteen Preparation and Action Stages.    Lisa Bernatherine C Caitlain Tweed, LCSW

## 2016-08-23 NOTE — Progress Notes (Signed)
Adult Psychoeducational Group Note  Date:  08/23/2016 Time:  10:57 PM  Group Topic/Focus:  Wrap-Up Group:   The focus of this group is to help patients review their daily goal of treatment and discuss progress on daily workbooks.   Participation Level:  Active  Participation Quality:  Appropriate and Inattentive  Affect:  Flat  Cognitive:  Appropriate  Insight: Limited  Engagement in Group:  Limited  Modes of Intervention:  Discussion  Additional Comments:  Patient reported working to eat more food throughout the day.  Patient reported agreeing to take her medication and being discharged.  Elmore GuiseSLOAN, Novah Nessel N 08/23/2016, 10:57 PM

## 2016-08-23 NOTE — Progress Notes (Signed)
Patient ID: Lisa Fitzpatrick, female   DOB: Jul 20, 1996, 20 y.o.   MRN: [id number] [facility] MD Progress Note  08/23/2016 5:16 PM Lisa Fitzpatrick  MRN:  [id number] Subjective:  Reports improving mood, and states she feels more stable . Describes frequent nausea, abdominal discomfort , particularly after eating- attributes this to gall bladder disease- she did have recent workup a few weeks ago, and was found to have gall stones . Reports Bentyl temporarily helpful to alleviate symptoms.  Objective : I have discussed case with treatment team and have met with patient . Patient presents with improving range of affect, although reports some frustration, rather than depression, regarding her abdominal symptoms as above . At this time appears comfortable and in no acute distress . No medication side effects, as noted ,abdominal symptoms preceded onset of medication trial , so they are not considered to be medication side effects at this time. Visible on unit, going to groups,interacting appropriately with selected peers .   Principal Problem:  Depression Diagnosis:   Patient Active Problem List   Diagnosis Date Noted  . Bipolar 1 disorder, depressed ([place]) [F31.9] 08/19/2016  . Bipolar affective disorder, current episode depressed ([place]) [F31.30] 08/19/2016   Total Time spent with patient: 20 minutes    Past Medical History:  Past Medical History:  Diagnosis Date  . Anxiety   . Bipolar disorder [facility])     Past Surgical History:  Procedure Laterality Date  . WISDOM TOOTH EXTRACTION     Family History: History reviewed. No pertinent family history.  Social History:  History  Alcohol Use  . Yes    Comment: occ     History  Drug Use  . Frequency: 7.0 times per week  . Types: Marijuana    Comment: daily user     Social History   Social History  . Marital status: Single    Spouse name: N/A  . Number of children: N/A  . Years of education: N/A   Social History Main Topics  . Smoking  status: Current Some Day Smoker    Years: 4.00  . Smokeless tobacco: Never Used     Comment: Hookah   . Alcohol use Yes     Comment: occ  . Drug use:     Frequency: 7.0 times per week    Types: Marijuana     Comment: daily user   . Sexual activity: Yes    Birth control/ protection: Condom   Other Topics Concern  . None   Social History Narrative  . None   Additional Social History:   Sleep: improved   Appetite:   Fair   Current Medications: Current Facility-Administered Medications  Medication Dose Route Frequency Provider Last Rate Last Dose  . acetaminophen (TYLENOL) tablet 650 mg  650 mg Oral Q4H PRN Patrecia Pour, NP   650 mg at 08/22/16 1901  . alum & mag hydroxide-simeth (MAALOX/MYLANTA) 200-200-20 MG/5ML suspension 30 mL  30 mL Oral PRN Patrecia Pour, NP   30 mL at 08/23/16 0027  . ARIPiprazole (ABILIFY) tablet 2 mg  2 mg Oral Daily Jenne Campus, MD   2 mg at 08/23/16 0913  . clotrimazole (LOTRIMIN) 1 % cream   Topical BID Kerrie Buffalo, NP      . dicyclomine (BENTYL) tablet 20 mg  20 mg Oral Q6H PRN Jenne Campus, MD      . ibuprofen (ADVIL,MOTRIN) tablet 600 mg  600 mg Oral Q8H PRN Patrecia Pour, NP  600 mg at 08/23/16 1415  . LORazepam (ATIVAN) tablet 0.5 mg  0.5 mg Oral Q6H PRN Jenne Campus, MD   0.5 mg at 08/23/16 1416  . magnesium hydroxide (MILK OF MAGNESIA) suspension 30 mL  30 mL Oral Daily PRN Patrecia Pour, NP      . ondansetron [facility]) tablet 4 mg  4 mg Oral Q8H PRN Patrecia Pour, NP   4 mg at 08/23/16 0647  . venlafaxine XR (EFFEXOR-XR) 24 hr capsule 75 mg  75 mg Oral Q breakfast Jenne Campus, MD   75 mg at 08/23/16 3893    Lab Results:  No results found for this or any previous visit (from the past 67 hour(s)).  Blood Alcohol level:  Lab Results  Component Value Date   ETH <5 08/18/2016   [facility]  05/19/2010    <5        LOWEST DETECTABLE LIMIT FOR SERUM ALCOHOL IS 5 mg/dL FOR MEDICAL PURPOSES ONLY    Metabolic Disorder  Labs:  No results found for: PROLACTIN Lab Results  Component Value Date   CHOL  05/21/2010    127        ATP III CLASSIFICATION:  <200     mg/dL   Desirable  200-239  mg/dL   Borderline High  >=240    mg/dL   High          TRIG 28 05/21/2010   HDL 55 05/21/2010   CHOLHDL 2.3 05/21/2010   VLDL 6 05/21/2010   LDLCALC  05/21/2010    66        Total Cholesterol/HDL:CHD Risk Coronary Heart Disease Risk Table                     Men   Women  1/2 Average Risk   3.4   3.3  Average Risk       5.0   4.4  2 X Average Risk   9.6   7.1  3 X Average Risk  23.4   11.0        Use the calculated Patient Ratio above and the CHD Risk Table to determine the patient's CHD Risk.        ATP III CLASSIFICATION (LDL):  <100     mg/dL   Optimal  100-129  mg/dL   Near or Above                    Optimal  130-159  mg/dL   Borderline  160-189  mg/dL   High  >190     mg/dL   Very High   LDLCALC  10/18/2009    72        Total Cholesterol/HDL:CHD Risk Coronary Heart Disease Risk Table                     Men   Women  1/2 Average Risk   3.4   3.3  Average Risk       5.0   4.4  2 X Average Risk   9.6   7.1  3 X Average Risk  23.4   11.0        Use the calculated Patient Ratio above and the CHD Risk Table to determine the patient's CHD Risk.        ATP III CLASSIFICATION (LDL):  <100     mg/dL   Optimal  100-129  mg/dL   Near or Above  Optimal  130-159  mg/dL   Borderline  160-189  mg/dL   High  >190     mg/dL   Very High    Physical Findings: AIMS: Facial and Oral Movements Muscles of Facial Expression: None, normal Lips and Perioral Area: None, normal Jaw: None, normal Tongue: None, normal,Extremity Movements Upper (arms, wrists, hands, fingers): None, normal Lower (legs, knees, ankles, toes): None, normal, Trunk Movements Neck, shoulders, hips: None, normal, Overall Severity Severity of abnormal movements (highest score from questions above): None,  normal Incapacitation due to abnormal movements: None, normal Patient's awareness of abnormal movements (rate only patient's report): No Awareness, Dental Status Current problems with teeth and/or dentures?: No Does patient usually wear dentures?: No  CIWA:    COWS:     Musculoskeletal: Strength & Muscle Tone: within normal limits Gait & Station: normal Patient leans: N/A  Psychiatric Specialty Exam: Physical Exam  ROS no headache, no chest pain, no shortness of breath, reports frequent nausea, some vague abdominal discomfort , no vomiting, no melenas .   Blood pressure 100/61, pulse 89, temperature 98.1 F (36.7 C), temperature source Oral, resp. rate 17, height 5' 5.5" (1.664 m), weight 160 lb (72.6 kg), last menstrual period 08/16/2016, SpO2 100 %.Body mass index is 26.22 kg/m.  General Appearance: Well Groomed  Eye Contact:  Good  Speech:  Normal Rate  Volume:  Normal  Mood: improving, at this time reports she does not feel significantly depressed   Affect:   reactive    Thought Process:  Linear  Orientation:  Full (Time, Place, and Person)  Thought Content:  denies hallucinations, no delusions expressed, not internally preoccupied   Suicidal Thoughts:  No- denies suicidal ideations, denies self injurious ideations , denies homicidal ideations   Homicidal Thoughts:  No  Memory:  recent and remote grossly intact   Judgement: improving   Insight:  Improving   Psychomotor Activity:  Normal  Concentration:  Concentration: Good and Attention Span: Good  Recall:  Good  Fund of Knowledge:  Good  Language:  Good  Akathisia:  Negative  Handed:  Right  AIMS (if indicated):     Assets:  Desire for Improvement Resilience  ADL's:  Intact  Cognition:  WNL  Sleep:  Number of Hours: 5.75   Assessment - patient improved compared  to admission- improved mood and range of affect, focusing more on discharge planning as she improves clinically. At this time denies mediation side  effects- does not feel her abdominal discomfort , nausea, is related to medications but rather to gall bladder disease- recent work up ( prior to admission ) revealed cholelithiasis.  States that Bentyl and Zofran have been helpful to alleviate GI symptoms, and denies side effects.   Treatment Plan Summary: Daily contact with patient to assess and evaluate symptoms and progress in treatment, Medication management, Plan inpatient admission  and medications as below  Encourage group and milieu participation to work on coping skills and symptom reduction Continue to encourage efforts /motivation to work on sobriety, relapse prevention efforts  Treatment team working on disposition planning options Continue Abilify 2 mgrs QDAY for mood disorder, antidepressant augmentation  Continue Effexor XR 75 mgrs QDAY for depression  Continue Ativan 0.5 mgrs Q 6 hours PRN for anxiety  Continue Zofran PRNs for nausea as needed  Continue Bentyl 20 mgrs Q 6 hours PRN for abdominal pain, discomfort as needed  Neita Garnet, MD 08/23/2016, 5:16 PM

## 2016-08-24 ENCOUNTER — Encounter (HOSPITAL_COMMUNITY): Payer: Self-pay | Admitting: Registered Nurse

## 2016-08-24 DIAGNOSIS — F332 Major depressive disorder, recurrent severe without psychotic features: Secondary | ICD-10-CM

## 2016-08-24 MED ORDER — ARIPIPRAZOLE 2 MG PO TABS: 2.0000 mg | ORAL_TABLET | Freq: Every day | ORAL | 0 refills | Status: DC

## 2016-08-24 MED ORDER — VENLAFAXINE HCL ER 75 MG PO CP24: 75.0000 mg | ORAL_CAPSULE | Freq: Every day | ORAL | 0 refills | Status: DC

## 2016-08-24 NOTE — Progress Notes (Signed)
D: Writer met with pt to discuss treatment. Pt stated that she's  less depressed today but continues to feel anxious. Pt given Ativan tonight at her request. Pt stated that she will go live with her mom once she's discharged but refuses to allow her mom to visit her here in the hospital. Pt stated that she won't have to interact with her mom as much when she returns home because they both work a lot of hours. Pt stated that her boyfriend comes to visit and that he's supportive. Pt denies any relationship issues with her boyfriend.  A: Medications discussed with pt. Verbal support provided. 15 minute checks performed for safety.  R: Pt receptive to tx.

## 2016-08-24 NOTE — Progress Notes (Signed)
Patient to discharged ambulatory to boyfriend in lobby. All discharge paperwork given and signed, valuables returned. Prescriptions given. Patient able to verbalize understanding. Patient stable, denies SI/HI/AVH. Patient given opportunity to express concerns and ask questions.

## 2016-08-24 NOTE — [facility] Group Notes (Signed)
[facility] Group Notes:  (Nursing/MHT/Case Management/Adjunct)  Date:  08/24/2016  Time:  10:52 AM  Type of Therapy:  Nurse Education  Participation Level:  Active  Participation Quality:  Attentive  Affect:  Appropriate  Cognitive:  Appropriate  Insight:  Appropriate  Engagement in Group:  Engaged  Modes of Intervention:  Discussion, Education and Support  Summary of Progress/Problems: Lisa Fitzpatrick shared that her goal is to be ready for discharge today.   Maurine SimmeringShugart, Dakai Braithwaite M 08/24/2016, 10:52 AM

## 2016-08-24 NOTE — [facility] Suicide Risk Assessment (Signed)
[facility] Discharge Suicide Risk Assessment   Principal Problem: Depression  Discharge Diagnoses:  Patient Active Problem List   Diagnosis Date Noted  . Bipolar 1 disorder, depressed ([facility]) [F31.9] 08/19/2016  . Bipolar affective disorder, current episode depressed ([facility]) [F31.30] 08/19/2016    Total Time spent with patient: 30 minutes  Musculoskeletal: Strength & Muscle Tone: within normal limits Gait & Station: normal Patient leans: N/A  Psychiatric Specialty Exam: ROS denies headache, no chest pain, reports intermittent abdominal pain and nausea, no vomiting, no diarrhea, no rash , no fever, no chills   Blood pressure 107/68, pulse 90, temperature 98 F (36.7 C), temperature source Oral, resp. rate 16, height 5' 5.5" (1.664 m), weight 160 lb (72.6 kg), last menstrual period 08/16/2016, SpO2 100 %.Body mass index is 26.22 kg/m.  General Appearance: Well Groomed  Eye Contact::  Good  Speech:  Normal Rate409  Volume:  Normal  Mood:  mood improved, states " I feel pretty good today "  Affect:  Appropriate, slightly constricted, but reactive   Thought Process:  Linear  Orientation:  Full (Time, Place, and Person)  Thought Content:  denies hallucinations, no delusions , not internally preoccupied   Suicidal Thoughts:  No- denies any suicidal or self injurious ideations   Homicidal Thoughts:  No- denies any homicidal or violent ideations   Memory:  recent and remote grossly intact   Judgement:  Other:  improved  Insight:  improved   Psychomotor Activity:  Normal  Concentration:  Good  Recall:  Good  Fund of Knowledge:Good  Language: Good  Akathisia:  Negative  Handed:  Right  AIMS (if indicated):     Assets:  Communication Skills Desire for Improvement Resilience  Sleep:  Number of Hours: 6.5  Cognition: WNL  ADL's:  Intact   Mental Status Per Nursing Assessment::   On Admission:     Demographic Factors:  20 year old single female, employed, plans to go live with her  mother for a period of time  Loss Factors: Recent relationship stressors, facing homelessness, financial difficulties   Historical Factors: History of depression, as per chart has been diagnosed with Bipolar Disorder in the past , but at this time does not endorse any clear history of mania, hypomania. History of suicide attempt as a teenager .   Risk Reduction Factors:   Sense of responsibility to family, Employed, Living with another person, especially a relative and Positive coping skills or problem solving skills  Continued Clinical Symptoms:  At this time patient is alert, attentive , well related, pleasant, mood improving, affect improving ,smiles appropriately at times, no thought disorder, no suicidal or self injurious ideations, no homicidal ideations,no hallucinations or delusions, future oriented . Denies medication side effects. Of note , has some nausea, abdominal discomfort , which she attributes to history of gall stones  Cognitive Features That Contribute To Risk:  No gross cognitive deficits noted upon discharge. Is alert , attentive, and oriented x 3    Suicide Risk:  Mild:  Suicidal ideation of limited frequency, intensity, duration, and specificity.  There are no identifiable plans, no associated intent, mild dysphoria and related symptoms, good self-control (both objective and subjective assessment), few other risk factors, and identifiable protective factors, including available and accessible social support.  Follow-up Information    CSW will contact you next week with appointment dates and times .        Loss adjuster, chartered. Go on 08/25/2016.   Why:  [facility] has a [facility] that you can access by going directly there Monday-Friday. Contact information: [address]  [id number] [phone]          Plan Of Care/Follow-up recommendations:  Activity:  as tolerated  Diet:  Regular  Tests:  NA  Other:  See  below   Patient is leaving in good spirits . Plans to go live with her mother for a period of time Follow up as above   Nehemiah MassedOBOS, FERNANDO, MD 08/24/2016, 11:22 AM

## 2016-08-24 NOTE — Discharge Summary (Signed)
Physician Discharge Summary Note  Patient:  Lisa Fitzpatrick is an 20 y.o., female MRN:  [id number] DOB:  04-28-1996 Patient phone:  [phone] (home)  Patient address:   [address] [place] [address],  Total Time spent with patient: 30 minutes  Date of Admission:  08/19/2016 Date of Discharge: 08/24/16  Reason for Admission:  Worsening depression and suicidal ideation with plan to drive car off cliff  Principal Problem: <principal problem not specified> Discharge Diagnoses: Patient Active Problem List   Diagnosis Date Noted  . Bipolar 1 disorder, depressed (HCC) [F31.9] 08/19/2016  . Bipolar affective disorder, current episode depressed (HCC) [F31.30] 08/19/2016    Past Psychiatric History:  2 prior psychiatric admissions for Depression and Suicidal Ideation.  Prior suicide attempt via hanging; self injurious behavior (cutting) stopped 2-3 yrs ago.  Prior diagnosis of Bipolar Disorder.    Past Medical History:  Past Medical History:  Diagnosis Date  . Anxiety   . Bipolar disorder [facility])     Past Surgical History:  Procedure Laterality Date  . WISDOM TOOTH EXTRACTION     Family History: History reviewed. No pertinent family history. Family Psychiatric  History: Denies Social History:  History  Alcohol Use  . Yes    Comment: occ     History  Drug Use  . Frequency: 7.0 times per week  . Types: Marijuana    Comment: daily user     Social History   Social History  . Marital status: Single    Spouse name: N/A  . Number of children: N/A  . Years of education: N/A   Social History Main Topics  . Smoking status: Current Some Day Smoker    Years: 4.00  . Smokeless tobacco: Never Used     Comment: Hookah   . Alcohol use Yes     Comment: occ  . Drug use:     Frequency: 7.0 times per week    Types: Marijuana     Comment: daily user   . Sexual activity: Yes    Birth control/ protection: Condom   Other Topics Concern  . None   Social History  Narrative  . None    Hospital Course:  Lisa Fitzpatrick was admitted to observation for <principal problem not specified> and crisis management.  She was treated with the following medications Abilify 2 mg daily and Effexor XR 75 mg for Major Depression. Medications were tolerated with no adverse reactions.  Lisa Fitzpatrick was discharged with current medication and was instructed on how to take medications as prescribed; (details listed below under Medication List).  Improvement was monitored by observation and Durwin Glaze report of symptom reduction; and emotional and mental status was also monitored by staff.          Lisa Fitzpatrick was evaluated for stability and plans for continued recovery upon discharge.  Lisa Fitzpatrick motivation was an integral factor for scheduling further treatment.  Employment, transportation, bed availability, health status, family support, and any pending legal issues were also considered during her during the hospital stay.  She was offered further treatment options upon discharge including but not limited to Residential, Intensive Outpatient, Outpatient treatment, and resources for shelters if needed.  Lisa Fitzpatrick will follow up with the services as listed below under Follow up Information.     Upon completion of this admission the Lisa Fitzpatrick was both mentally and medically stable for discharge denying suicidal/homicidal ideation, auditory/visual/tactile hallucinations, delusional thoughts and  paranoia.      Physical Findings: AIMS: Facial and Oral Movements Muscles of Facial Expression: None, normal Lips and Perioral Area: None, normal Jaw: None, normal Tongue: None, normal,Extremity Movements Upper (arms, wrists, hands, fingers): None, normal Lower (legs, knees, ankles, toes): None, normal, Trunk Movements Neck, shoulders, hips: None, normal, Overall Severity Severity of abnormal movements (highest score from questions above): None, normal Incapacitation due  to abnormal movements: None, normal Patient's awareness of abnormal movements (rate only patient's report): No Awareness, Dental Status Current problems with teeth and/or dentures?: No Does patient usually wear dentures?: No  CIWA:    COWS:     Musculoskeletal: Strength & Muscle Tone: within normal limits Gait & Station: normal Patient leans: N/A  Psychiatric Specialty Exam:  See Suicide Risk Assessment Physical Exam  ROS  Blood pressure 107/68, pulse 90, temperature 98 F (36.7 C), temperature source Oral, resp. rate 16, height 5' 5.5" (1.664 m), weight 72.6 kg (160 lb), last menstrual period 08/16/2016, SpO2 100 %.Body mass index is 26.22 kg/m.    Have you used any form of tobacco in the last 30 days? (Cigarettes, Smokeless Tobacco, Cigars, and/or Pipes): Yes  Has this patient used any form of tobacco in the last 30 days? (Cigarettes, Smokeless Tobacco, Cigars, and/or Pipes) Yes, No  Blood Alcohol level:  Lab Results  Component Value Date   ETH <5 08/18/2016   [facility]  05/19/2010    <5        LOWEST DETECTABLE LIMIT FOR SERUM ALCOHOL IS 5 mg/dL FOR MEDICAL PURPOSES ONLY    Metabolic Disorder Labs:   No results found for: PROLACTIN Lab Results  Component Value Date   CHOL  05/21/2010    127        ATP III CLASSIFICATION:  <200     mg/dL   Desirable  161-096  mg/dL   Borderline High  >=045    mg/dL   High          TRIG 28 05/21/2010   HDL 55 05/21/2010   CHOLHDL 2.3 05/21/2010   VLDL 6 05/21/2010   LDLCALC  05/21/2010    66        Total Cholesterol/HDL:CHD Risk Coronary Heart Disease Risk Table                     Men   Women  1/2 Average Risk   3.4   3.3  Average Risk       5.0   4.4  2 X Average Risk   9.6   7.1  3 X Average Risk  23.4   11.0        Use the calculated Patient Ratio above and the CHD Risk Table to determine the patient's CHD Risk.        ATP III CLASSIFICATION (LDL):  <100     mg/dL   Optimal  409-811  mg/dL   Near or Above                     Optimal  130-159  mg/dL   Borderline  914-782  mg/dL   High  >956     mg/dL   Very High   LDLCALC  10/18/2009    72        Total Cholesterol/HDL:CHD Risk Coronary Heart Disease Risk Table                     Men   Women  1/2  Average Risk   3.4   3.3  Average Risk       5.0   4.4  2 X Average Risk   9.6   7.1  3 X Average Risk  23.4   11.0        Use the calculated Patient Ratio above and the CHD Risk Table to determine the patient's CHD Risk.        ATP III CLASSIFICATION (LDL):  <100     mg/dL   Optimal  161-096100-129  mg/dL   Near or Above                    Optimal  130-159  mg/dL   Borderline  045-409160-189  mg/dL   High  >811>190     mg/dL   Very High    See Psychiatric Specialty Exam and Suicide Risk Assessment completed by Attending Physician prior to discharge.  Discharge destination:  Home  Is patient on multiple antipsychotic therapies at discharge:  No   Has Patient had three or more failed trials of antipsychotic monotherapy by history:  No  Recommended Plan for Multiple Antipsychotic Therapies: NA  Discharge Instructions    Activity as tolerated - No restrictions    Complete by:  As directed   Diet general    Complete by:  As directed   Discharge instructions    Complete by:  As directed   Take all of you medications as prescribed by your mental healthcare provider.  Report any adverse effects and reactions from your medications to your outpatient provider promptly.  Do not engage in alcohol and or illegal drug use while on prescription medicines. Keep all scheduled appointments. This is to ensure that you are getting refills on time and to avoid any interruption in your medication.  If you are unable to keep an appointment call to reschedule.  Be sure to follow up with resources and follow ups given. In the event of worsening symptoms call the crisis hotline, 911, and or go to the nearest emergency department for appropriate evaluation and treatment of  symptoms. Follow-up with your primary care provider for your medical issues, concerns and or health care needs.       Medication List    STOP taking these medications   acetaminophen 500 MG tablet Commonly known as:  TYLENOL   amphetamine-dextroamphetamine 15 MG tablet Commonly known as:  ADDERALL   cephALEXin 500 MG capsule Commonly known as:  KEFLEX   CRANBERRY PO   fluconazole 150 MG tablet Commonly known as:  DIFLUCAN   traMADol 50 MG tablet Commonly known as:  ULTRAM     TAKE these medications     Indication  ARIPiprazole 2 MG tablet Commonly known as:  ABILIFY Take 1 tablet (2 mg total) by mouth daily.  Indication:  Major Depressive Disorder   clotrimazole 1 % cream Commonly known as:  LOTRIMIN Apply 1 application topically 2 (two) times daily.  Indication:  Ringworm of the Body   dicyclomine 20 MG tablet Commonly known as:  BENTYL Take 1 tablet (20 mg total) by mouth 2 (two) times daily as needed (abdominal pain).  Indication:  Irritable Bowel Syndrome   ondansetron 4 MG disintegrating tablet Commonly known as:  ZOFRAN ODT Take 1 tablet (4 mg total) by mouth every 8 (eight) hours as needed for nausea or vomiting.  Indication:  nausea   venlafaxine XR 75 MG 24 hr capsule Commonly known as:  EFFEXOR-XR Take 1 capsule (  75 mg total) by mouth daily with breakfast.  Indication:  Major Depressive Disorder      Follow-up Information    CSW will contact you next week with appointment dates and times .        Loss adjuster, chartered. Go on 08/25/2016.   Why:  [facility] has a [facility] that you can access by going directly there Monday-Friday. Contact information: [address]  [address] [phone]          Follow-up recommendations:  Activity:  As tolerated Diet:  As tolerated  Comments:  NAKEIA CALVI has been instructed to take medications as prescribed; and report adverse effects to outpatient  provider.  Follow up with primary doctor for any medical issues and If symptoms recur report to nearest emergency or crisis hot line.    SignedAssunta Found, NP 08/24/2016, 9:47 AM   Patient seen, Suicide Assessment Completed.  Disposition Plan Reviewed

## 2016-08-24 NOTE — Progress Notes (Signed)
  [facility] :  Will you be returning to the same living situation after discharge:  No. Going to live with family At discharge, do you have transportation home?: Yes,  family to pick up Do you have the ability to pay for your medications: No. Provider(s)' help is anticipated.  Release of information consent forms completed and in the chart;  Patient's signature needed at discharge.  Patient to Follow up at: Follow-up Information    CSW will contact you next week with appointment dates and times .        [facility] Go on 08/25/2016.   Why:  [facility] has a [facility] that you can access by going directly there Monday-Friday. Contact information: [address]  [place]  [phone]          Next level of care provider [facility] Link:no  Safety Planning and Suicide Prevention discussed: Yes,  with patient only, declined with other  Have you used any form of tobacco in the last 30 days? (Cigarettes, Smokeless Tobacco, Cigars, and/or Pipes): Yes  Has patient been referred to the Quitline?: Patient refused referral  Patient has been referred for addiction treatment: Patient refused substance abuse-specific referrals.  Carloyn JaegerMareida J Grossman-Orr 08/24/2016, 9:34 AM

## 2016-08-25 ENCOUNTER — Encounter (HOSPITAL_COMMUNITY): Payer: Self-pay | Admitting: Emergency Medicine

## 2016-08-25 ENCOUNTER — Emergency Department (HOSPITAL_COMMUNITY): Payer: 59

## 2016-08-25 ENCOUNTER — Emergency Department (HOSPITAL_COMMUNITY)
Admission: EM | Admit: 2016-08-25 | Discharge: 2016-08-26 | Disposition: A | Discharge status: Home / Self Care | Payer: 59 | Attending: Emergency Medicine | Admitting: Emergency Medicine

## 2016-08-25 DIAGNOSIS — F17200 Nicotine dependence, unspecified, uncomplicated: Secondary | ICD-10-CM | POA: Diagnosis not present

## 2016-08-25 DIAGNOSIS — K8020 Calculus of gallbladder without cholecystitis without obstruction: Secondary | ICD-10-CM | POA: Diagnosis not present

## 2016-08-25 DIAGNOSIS — Z79899 Other long term (current) drug therapy: Secondary | ICD-10-CM | POA: Insufficient documentation

## 2016-08-25 DIAGNOSIS — R1011 Right upper quadrant pain: Secondary | ICD-10-CM | POA: Diagnosis present

## 2016-08-25 LAB — COMPREHENSIVE METABOLIC PANEL
ALBUMIN: 4.5 g/dL (ref 3.5–5.0)
ALT: 8 U/L — ABNORMAL LOW (ref 14–54)
ANION GAP: 5 (ref 5–15)
AST: 15 U/L (ref 15–41)
Alkaline Phosphatase: 80 U/L (ref 38–126)
BILIRUBIN TOTAL: 0.5 mg/dL (ref 0.3–1.2)
BUN: 17 mg/dL (ref 6–20)
CHLORIDE: 106 mmol/L (ref 101–111)
CO2: 27 mmol/L (ref 22–32)
Calcium: 9.6 mg/dL (ref 8.9–10.3)
Creatinine, Ser: 0.71 mg/dL (ref 0.44–1.00)
GFR calc Af Amer: >60 mL/min (ref >60)
GFR calc non Af Amer: >60 mL/min (ref >60)
GLUCOSE: 108 mg/dL — AB (ref 65–99)
POTASSIUM: 3.9 mmol/L (ref 3.5–5.1)
SODIUM: 138 mmol/L (ref 135–145)
TOTAL PROTEIN: 8.2 g/dL — AB (ref 6.5–8.1)

## 2016-08-25 LAB — URINALYSIS, ROUTINE W REFLEX MICROSCOPIC
BILIRUBIN URINE: NEGATIVE
GLUCOSE, UA: NEGATIVE mg/dL
Hgb urine dipstick: NEGATIVE
KETONES UR: NEGATIVE mg/dL
Leukocytes, UA: NEGATIVE
NITRITE: NEGATIVE
PH: 6 (ref 5.0–8.0)
Protein, ur: NEGATIVE mg/dL
SPECIFIC GRAVITY, URINE: 1.022 (ref 1.005–1.030)

## 2016-08-25 LAB — CBC
HEMATOCRIT: 41.6 % (ref 36.0–46.0)
HEMOGLOBIN: 13.9 g/dL (ref 12.0–15.0)
MCH: 27.6 pg (ref 26.0–34.0)
MCHC: 33.4 g/dL (ref 30.0–36.0)
MCV: 82.5 fL (ref 78.0–100.0)
Platelets: 293 10*3/uL (ref 150–400)
RBC: 5.04 MIL/uL (ref 3.87–5.11)
RDW: 13.3 % (ref 11.5–15.5)
WBC: 7.9 10*3/uL (ref 4.0–10.5)

## 2016-08-25 LAB — LIPASE, BLOOD: Lipase: 31 U/L (ref 11–51)

## 2016-08-25 LAB — I-STAT BETA HCG BLOOD, ED (MC, WL, AP ONLY): I-stat hCG, quantitative: <5 m[IU]/mL (ref <5)

## 2016-08-25 MED ORDER — FENTANYL CITRATE (PF) 100 MCG/2ML IJ SOLN
100.0000 ug | Freq: Once | INTRAMUSCULAR | Status: AC
  Administered 2016-08-25: 100 ug via INTRAVENOUS
  Filled 2016-08-25: qty 2

## 2016-08-25 MED ORDER — FENTANYL CITRATE (PF) 100 MCG/2ML IJ SOLN: 100.0000 ug | Freq: Once | INTRAMUSCULAR | Status: DC

## 2016-08-25 MED ORDER — SODIUM CHLORIDE 0.9 % IV BOLUS (SEPSIS)
1000.0000 mL | Freq: Once | INTRAVENOUS | Status: AC
  Administered 2016-08-25: 1000 mL via INTRAVENOUS

## 2016-08-25 MED ORDER — ONDANSETRON HCL 4 MG/2ML IJ SOLN
4.0000 mg | Freq: Once | INTRAMUSCULAR | Status: AC
  Administered 2016-08-25: 4 mg via INTRAVENOUS
  Filled 2016-08-25: qty 2

## 2016-08-25 NOTE — ED Triage Notes (Addendum)
Pt states that she has lower right abdominal pain that radiates to the left side 9/10. [facility] the [facility]. Pt stated she had nausea with no emesis. Pt states that when she eats the pain gets worse. Using a heating pad makes the pain better.

## 2016-08-25 NOTE — ED Notes (Signed)
Ultrasound at bedside

## 2016-08-26 MED ORDER — OXYCODONE-ACETAMINOPHEN 10-325 MG PO TABS: 1.0000 | ORAL_TABLET | ORAL | 0 refills | Status: DC | PRN

## 2016-08-26 MED ORDER — PROMETHAZINE HCL 25 MG PO TABS: 25.0000 mg | ORAL_TABLET | Freq: Four times a day (QID) | ORAL | 0 refills | Status: DC | PRN

## 2016-08-26 NOTE — ED Provider Notes (Signed)
[facility]-EMERGENCY DEPT Provider Note   CSN: [id number] Arrival date & time: 08/25/16  1536     History   Chief Complaint Chief Complaint  Patient presents with  . Abdominal Pain  . Emesis    HPI Lisa Fitzpatrick is a 20 y.o. female.  HPI Patient presents to the emergency department with right upper abdominal pain over the last few days.  Patient states that she was seen here and found to have gallstones.  She followed up with the surgeon was told that she did not require surgery at this time.  Patient states that her symptoms are worsened over the last few days.  Patient states that she has had nausea but no vomiting.  Patient states that the Percocet did not seem to help with her discomfort, but Bentyl did help. The patient denies chest pain, shortness of breath, headache,blurred vision, neck pain, fever, cough, weakness, numbness, dizziness, anorexia, edema, abdominal pain, nausea, vomiting, diarrhea, rash, back pain, dysuria, hematemesis, bloody stool, near syncope, or syncope. Past Medical History:  Diagnosis Date  . Anxiety   . Bipolar disorder [facility](HCC)     Patient Active Problem List   Diagnosis Date Noted  . Severe episode of recurrent major depressive disorder, without psychotic features (HCC)   . Bipolar 1 disorder, depressed (HCC) 08/19/2016  . Bipolar affective disorder, current episode depressed (HCC) 08/19/2016    Past Surgical History:  Procedure Laterality Date  . WISDOM TOOTH EXTRACTION      OB History    No data available       Home Medications    Prior to Admission medications   Medication Sig Start Date End Date Taking? Authorizing Provider  ARIPiprazole (ABILIFY) 2 MG tablet Take 1 tablet (2 mg total) by mouth daily. 08/24/16   Shuvon B Rankin, NP  clotrimazole (LOTRIMIN) 1 % cream Apply 1 application topically 2 (two) times daily.    Historical Provider, MD  dicyclomine (BENTYL) 20 MG tablet Take 1 tablet (20 mg total) by mouth 2 (two) times daily as  needed (abdominal pain). 07/22/16   Ace GinsSerena Y Sam, PA-C  ondansetron (ZOFRAN ODT) 4 MG disintegrating tablet Take 1 tablet (4 mg total) by mouth every 8 (eight) hours as needed for nausea or vomiting. 07/22/16   Ace GinsSerena Y Sam, PA-C  venlafaxine XR (EFFEXOR-XR) 75 MG 24 hr capsule Take 1 capsule (75 mg total) by mouth daily with breakfast. 08/24/16   Shuvon B Rankin, NP    Family History No family history on file.  Social History Social History  Substance Use Topics  . Smoking status: Current Some Day Smoker    Years: 4.00  . Smokeless tobacco: Never Used     Comment: Hookah   . Alcohol use Yes     Comment: occ     Allergies   Codeine   Review of Systems Review of Systems All other systems negative except as documented in the HPI. All pertinent positives and negatives as reviewed in the HPI.  Physical Exam Updated Vital Signs BP 109/70 (BP Location: Right Arm)   Pulse (!) 54   Temp 98.2 F (36.8 C) (Oral)   Resp 14   Ht 5' 5.5" (1.664 m)   Wt 70.3 kg   LMP 08/16/2016 (Exact Date)   SpO2 98%   BMI 25.40 kg/m   Physical Exam  Constitutional: She is oriented to person, place, and time. She appears well-developed and well-nourished. No distress.  HENT:  Head: Normocephalic and atraumatic.  Mouth/Throat: Oropharynx  is clear and moist.  Eyes: Pupils are equal, round, and reactive to light.  Neck: Normal range of motion. Neck supple.  Cardiovascular: Normal rate, regular rhythm and normal heart sounds.  Exam reveals no gallop and no friction rub.   No murmur heard. Pulmonary/Chest: Effort normal and breath sounds normal. No respiratory distress. She has no wheezes.  Abdominal: Soft. Bowel sounds are normal. She exhibits no distension and no mass. There is tenderness. There is no guarding.  Neurological: She is alert and oriented to person, place, and time. She exhibits normal muscle tone. Coordination normal.  Skin: Skin is warm and dry. No rash noted. No erythema.    Psychiatric: She has a normal mood and affect. Her behavior is normal.  Nursing note and vitals reviewed.    ED Treatments / Results  Labs (all labs ordered are listed, but only abnormal results are displayed) Labs Reviewed  COMPREHENSIVE METABOLIC PANEL - Abnormal; Notable for the following:       Result Value   Glucose, Bld 108 (*)    Total Protein 8.2 (*)    ALT 8 (*)    All other components within normal limits  LIPASE, BLOOD  CBC  URINALYSIS, ROUTINE W REFLEX MICROSCOPIC (NOT AT [facility])  I-STAT BETA HCG BLOOD, ED ([facility], [facility], [facility] ONLY)    EKG  EKG Interpretation None       Radiology US Abdomen Limited Ruq  Result Date: 08/26/2016 CLINICAL DATA:  Initial evaluation for acute right upper quadrant abdominal pain. EXAM: US ABDOMEN LIMITED - RIGHT UPPER QUADRANT COMPARISON:  Prior ultrasound from 07/22/2016. FINDINGS: Gallbladder: Multiple echogenic shadowing stones fill the gallbladder lumen, largest of which measured 13 mm. Gallbladder wall at the upper limits of normal measuring 3-4 mm. No free pericholecystic fluid. No sonographic Murphy sign elicited on exam. Common bile duct: Diameter: 3.5 mm Liver: No focal lesion identified. Increased echogenicity, suggestive of steatosis. IMPRESSION: 1. Cholelithiasis without other sonographic features for acute cholecystitis. No biliary dilatation. 2. Hepatic steatosis. Electronically Signed   By: Rise Mu M.D.   On: 08/26/2016 00:00    Procedures Procedures (including critical care time)  Medications Ordered in ED Medications  sodium chloride 0.9 % bolus 1,000 mL (0 mLs Intravenous Stopped 08/25/16 2242)  ondansetron (ZOFRAN) injection 4 mg (4 mg Intravenous Given 08/25/16 2134)  fentaNYL (SUBLIMAZE) injection 100 mcg (100 mcg Intravenous Given 08/25/16 2348)     Initial Impression / Assessment and Plan / ED Course  I have reviewed the triage vital signs and the nursing notes.  Pertinent labs & imaging results that were  available during my care of the patient were reviewed by me and considered in my medical decision making (see chart for details).  Clinical Course   The patient, on reexamination does not have any abdominal pain.  After pain control.  The patient does have gallstones in her gallbladder and advises she will need to seek the surgeon tomorrow afternoon in their emergency clinic.  The mom seems somewhat upset about this, but advised that her liver enzyme tests.  White count an ultrasound did not show any acute process.  The patient will need further evaluation, though due to the fact she has a gallbladder full of stones   Final Clinical Impressions(s) / ED Diagnoses   Final diagnoses:  RUQ pain    New Prescriptions New Prescriptions   No medications on file     Charlestine Night, PA-C 08/26/16 0037    Lavera Guise, MD 08/26/16  0331  

## 2016-08-26 NOTE — Discharge Instructions (Signed)
[facility] for an appointment in the afternoon emergency clinic.  Advise them that you were seen in the emergency department.  We wanted you followed up as soon as possible

## 2016-09-05 ENCOUNTER — Other Ambulatory Visit: Payer: Self-pay | Admitting: Surgery

## 2016-09-24 NOTE — H&P (Signed)
Lisa Fitzpatrick 09/05/2016 9:19 AM Location: [facility] Patient #: [id number] DOB: 1996/09/09 Single / Language: [place] / Race: White Female   History of Present Illness (Jamee Pacholski A. Magnus Ivan MD; 09/05/2016 9:42 AM) The patient is a 20 year old female who presents for evaluation of gall stones. She is here for a follow-up visit. When she was seen in August for gallstones, she was not symptomatic at that time. She is now having right upper quadrant abdominal pain with nausea after fatty meals. She rarely has emesis. The pain is described as sharp and hurting through to her back.   Allergies Fay Records, [place]; 09/05/2016 9:19 AM) Codeine Phosphate *ANALGESICS - OPIOID*  Medication History Fay Records, CMA; 09/05/2016 9:19 AM) Bentyl (10MG  Capsule, Oral) Active. Effexor XR (37.5MG  Capsule ER 24HR, Oral) Active. Abilify (2MG  Tablet, Oral) Active. Medications Reconciled  Other Problems Geanie Kenning, CMA Anxiety Disorder Bladder Problems Cholelithiasis Depression Heart murmur  Past Surgical History Geanie Kenning, CMA;  No pertinent past surgical history  Diagnostic Studies History Lynne Leader [place], [place];  Colonoscopy never Mammogram never Pap Smear 1-5 years ago  Allergies Geanie Kenning, CMA; 08/13/2016 9:49 AM) Codeine Phosphate *ANALGESICS - OPIOID*  Social History Geanie Kenning, CMA; 08/13/2016 9:46 AM) Alcohol use Occasional alcohol use. Illicit drug use Uses socially only. No caffeine use Tobacco use Former smoker.  Family History Geanie Kenning, [place]; 08/13/2016 9:46 AM) Family history unknown First Degree Relatives  Pregnancy / Birth History Geanie Kenning, [place]; 08/13/2016 9:46 AM) Age at menarche 10 years. Gravida 1 Maternal age 3-25 Para 0 Regular periods    Review of Systems Geanie Kenning CMA; 08/13/2016 9:46 AM) General Present- Appetite Loss. Not Present- Chills, Fatigue, Fever, Night Sweats,  Weight Gain and Weight Loss. Skin Present- Rash. Not Present- Change in Wart/Mole, Dryness, Hives, Jaundice, New Lesions, Non-Healing Wounds and Ulcer. HEENT Present- Earache. Not Present- Hearing Loss, Hoarseness, Nose Bleed, Oral Ulcers, Ringing in the Ears, Seasonal Allergies, Sinus Pain, Sore Throat, Visual Disturbances, Wears glasses/contact lenses and Yellow Eyes. Respiratory Not Present- Bloody sputum, Chronic Cough, Difficulty Breathing, Snoring and Wheezing. Breast Not Present- Breast Mass, Breast Pain, Nipple Discharge and Skin Changes. Cardiovascular Not Present- Chest Pain, Difficulty Breathing Lying Down, Leg Cramps, Palpitations, Rapid Heart Rate, Shortness of Breath and Swelling of Extremities. Gastrointestinal Not Present- Abdominal Pain, Bloating, Bloody Stool, Change in Bowel Habits, Chronic diarrhea, Constipation, Difficulty Swallowing, Excessive gas, Gets full quickly at meals, Hemorrhoids, Indigestion, Nausea, Rectal Pain and Vomiting. Female Genitourinary Present- Frequency, Nocturia, Pelvic Pain and Urgency. Not Present- Painful Urination. Musculoskeletal Not Present- Back Pain, Joint Pain, Joint Stiffness, Muscle Pain, Muscle Weakness and Swelling of Extremities. Neurological Present- Fainting. Not Present- Decreased Memory, Headaches, Numbness, Seizures, Tingling, Tremor, Trouble walking and Weakness. Psychiatric Not Present- Anxiety, Bipolar, Change in Sleep Pattern, Depression, Fearful and Frequent crying. Endocrine Not Present- Cold Intolerance, Excessive Hunger, Hair Changes, Heat Intolerance, Hot flashes and New Diabetes. Hematology Not Present- Blood Thinners, Easy Bruising, Excessive bleeding, Gland problems, HIV and Persistent Infections.  Vitals  Weight: 162 lb Height: 65in Body Surface Area: 1.81 m Body Mass Index: 26.96 kg/m  Temp.: 98.61F(Oral)  Pulse: 60 (Regular)  Resp.: 16 (Unlabored)  BP: 130/62 (Sitting, Left Arm, Standard)   Physical  Exam General Mental Status-Alert. General Appearance-Consistent with stated age. Hydration-Well hydrated. Voice-Normal.  Head and Neck Head-normocephalic, atraumatic with no lesions or palpable masses.  Eye Eyeball - Bilateral-Extraocular movements intact. Sclera/Conjunctiva - Bilateral-No scleral icterus.  Chest and Lung Exam Chest and lung exam reveals -  quiet, even and easy respiratory effort with no use of accessory muscles and on auscultation, normal breath sounds, no adventitious sounds and normal vocal resonance. Inspection Chest Wall - Normal. Back - normal.  Cardiovascular Cardiovascular examination reveals -on palpation PMI is normal in location and amplitude, no palpable S3 or S4. Normal cardiac borders., normal heart sounds, regular rate and rhythm with no murmurs, carotid auscultation reveals no bruits and normal pedal pulses bilaterally.  Abdomen Inspection Inspection of the abdomen reveals - No Hernias. Skin - Scar - no surgical scars. Palpation/Percussion Palpation and Percussion of the abdomen reveal - Soft, Non Tender, No Rebound tenderness, No Rigidity (guarding) and No hepatosplenomegaly. Auscultation Auscultation of the abdomen reveals - Bowel sounds normal.  Neurologic Neurologic evaluation reveals -alert and oriented x 3 with no impairment of recent or remote memory. Mental Status-Normal.  Musculoskeletal Normal Exam - Left-Upper Extremity Strength Normal and Lower Extremity Strength Normal. Normal Exam - Right-Upper Extremity Strength Normal, Lower Extremity Weakness.         Assessment & Plan (Toshika Parrow A. Magnus IvanBlackman MD; 09/05/2016 9:43 AM) GALL STONES (K80.20) Impression: A repeat ultrasound shows a gallbladder full of gallstones but no evidence of cholecystitis. Her white blood count is normal and her liver function tests are normal. At this point, I believe the gall stones are symptomatic. Laparoscopic cholecystectomy is  recommended. I gave the patient and her mother literature regarding surgery. I discussed the risks which includes but is not limited to bleeding, infection, bile duct injury, bile leak, injury to other structures, the need to convert to an open procedure, etc. Cardiopulmonary issues, postoperative recovery, etc. They understand and wish to proceed with surgery

## 2016-09-24 NOTE — Progress Notes (Signed)
Several unsuccessful attempts were made to contact pt. Called surgeon's office and lvm with for surgical scheduler to return call with alternate contact phone number for pt; waiting for follow up phone call. Pt made aware to stop taking Aspirin, vitamins, fish oil and herbal medications. Do not take any NSAIDs ie: Ibuprofen, Advil, Naproxen, BC and Goody Powder or any medication containing Aspirin.

## 2016-09-24 NOTE — Anesthesia Preprocedure Evaluation (Addendum)
Anesthesia Evaluation  Patient identified by MRN, date of birth, ID band Patient awake    Reviewed: Allergy & Precautions, NPO status , Patient's Chart, lab work & pertinent test results  History of Anesthesia Complications (+) PONV and history of anesthetic complications  Airway Mallampati: I  TM Distance: >3 FB Neck ROM: Full    Dental  (+) Dental Advisory Given, Teeth Intact   Pulmonary neg shortness of breath, neg sleep apnea, neg COPD, neg recent URI, former smoker,    Pulmonary exam normal breath sounds clear to auscultation       Cardiovascular negative cardio ROS   Rhythm:Regular Rate:Normal     Neuro/Psych PSYCHIATRIC DISORDERS Anxiety Depression Bipolar Disorder negative neurological ROS     GI/Hepatic Neg liver ROS, neg GERD  ,cholelithiasis   Endo/Other  negative endocrine ROS  Renal/GU negative Renal ROS     Musculoskeletal   Abdominal (+) - obese,   Peds  Hematology negative hematology ROS (+)   Anesthesia Other Findings   Reproductive/Obstetrics                            Anesthesia Physical Anesthesia Plan  ASA: II  Anesthesia Plan: General   Post-op Pain Management:    Induction: Intravenous  Airway Management Planned: Oral ETT  Additional Equipment:   Intra-op Plan:   Post-operative Plan: Extubation in OR  Informed Consent: I have reviewed the patients History and Physical, chart, labs and discussed the procedure including the risks, benefits and alternatives for the proposed anesthesia with the patient or authorized representative who has indicated his/her understanding and acceptance.   Dental advisory given  Plan Discussed with: CRNA  Anesthesia Plan Comments: (Risks of general anesthesia discussed including, but not limited to, sore throat, hoarse voice, chipped/damaged teeth, injury to vocal cords, nausea and vomiting, allergic reactions, lung  infection, heart attack, stroke, and death. All questions answered. )       Anesthesia Quick Evaluation

## 2016-09-25 ENCOUNTER — Ambulatory Visit (HOSPITAL_COMMUNITY): Payer: 59 | Admitting: Anesthesiology

## 2016-09-25 ENCOUNTER — Encounter (HOSPITAL_COMMUNITY)
Admission: RE | Disposition: A | Discharge status: Home / Self Care | Payer: Self-pay | Source: Ambulatory Visit | Attending: Surgery

## 2016-09-25 ENCOUNTER — Ambulatory Visit (HOSPITAL_COMMUNITY)
Admission: RE | Admit: 2016-09-25 | Discharge: 2016-09-25 | Disposition: A | Discharge status: Home / Self Care | Payer: 59 | Source: Ambulatory Visit | Attending: Surgery | Admitting: Surgery

## 2016-09-25 ENCOUNTER — Encounter (HOSPITAL_COMMUNITY): Payer: Self-pay | Admitting: *Deleted

## 2016-09-25 DIAGNOSIS — Z885 Allergy status to narcotic agent status: Secondary | ICD-10-CM | POA: Diagnosis not present

## 2016-09-25 DIAGNOSIS — R011 Cardiac murmur, unspecified: Secondary | ICD-10-CM | POA: Insufficient documentation

## 2016-09-25 DIAGNOSIS — Z87891 Personal history of nicotine dependence: Secondary | ICD-10-CM | POA: Insufficient documentation

## 2016-09-25 DIAGNOSIS — K828 Other specified diseases of gallbladder: Secondary | ICD-10-CM | POA: Insufficient documentation

## 2016-09-25 DIAGNOSIS — K8010 Calculus of gallbladder with chronic cholecystitis without obstruction: Secondary | ICD-10-CM | POA: Diagnosis not present

## 2016-09-25 DIAGNOSIS — F329 Major depressive disorder, single episode, unspecified: Secondary | ICD-10-CM | POA: Insufficient documentation

## 2016-09-25 DIAGNOSIS — F419 Anxiety disorder, unspecified: Secondary | ICD-10-CM | POA: Diagnosis not present

## 2016-09-25 DIAGNOSIS — K811 Chronic cholecystitis: Secondary | ICD-10-CM | POA: Diagnosis present

## 2016-09-25 HISTORY — DX: Major depressive disorder, single episode, unspecified: F32.9

## 2016-09-25 HISTORY — PX: CHOLECYSTECTOMY: SHX55

## 2016-09-25 HISTORY — DX: Other specified postprocedural states: Z98.890

## 2016-09-25 HISTORY — DX: Nausea with vomiting, unspecified: R11.2

## 2016-09-25 HISTORY — DX: Depression, unspecified: F32.A

## 2016-09-25 LAB — CBC
HEMATOCRIT: 37.4 % (ref 36.0–46.0)
HEMOGLOBIN: 11.9 g/dL — AB (ref 12.0–15.0)
MCH: 27.5 pg (ref 26.0–34.0)
MCHC: 31.8 g/dL (ref 30.0–36.0)
MCV: 86.4 fL (ref 78.0–100.0)
Platelets: 190 10*3/uL (ref 150–400)
RBC: 4.33 MIL/uL (ref 3.87–5.11)
RDW: 13.5 % (ref 11.5–15.5)
WBC: 6.2 10*3/uL (ref 4.0–10.5)

## 2016-09-25 LAB — HCG, SERUM, QUALITATIVE: PREG SERUM: NEGATIVE

## 2016-09-25 SURGERY — LAPAROSCOPIC CHOLECYSTECTOMY
Anesthesia: General | Site: Abdomen

## 2016-09-25 MED ORDER — MIDAZOLAM HCL 2 MG/2ML IJ SOLN
INTRAMUSCULAR | Status: AC
  Filled 2016-09-25: qty 2

## 2016-09-25 MED ORDER — FENTANYL CITRATE (PF) 100 MCG/2ML IJ SOLN
INTRAMUSCULAR | Status: DC | PRN
  Administered 2016-09-25: 100 ug via INTRAVENOUS

## 2016-09-25 MED ORDER — FENTANYL CITRATE (PF) 100 MCG/2ML IJ SOLN
INTRAMUSCULAR | Status: AC
  Filled 2016-09-25: qty 2

## 2016-09-25 MED ORDER — SUGAMMADEX SODIUM 200 MG/2ML IV SOLN
INTRAVENOUS | Status: DC | PRN
  Administered 2016-09-25: 200 mg via INTRAVENOUS

## 2016-09-25 MED ORDER — CEFAZOLIN SODIUM-DEXTROSE 2-4 GM/100ML-% IV SOLN
2.0000 g | INTRAVENOUS | Status: AC
  Administered 2016-09-25: 2 g via INTRAVENOUS

## 2016-09-25 MED ORDER — SODIUM CHLORIDE 0.9 % IR SOLN
Status: DC | PRN
  Administered 2016-09-25: 1000 mL

## 2016-09-25 MED ORDER — PROPOFOL 10 MG/ML IV BOLUS
INTRAVENOUS | Status: AC
  Filled 2016-09-25: qty 20

## 2016-09-25 MED ORDER — TRAMADOL HCL 50 MG PO TABS: 50.0000 mg | ORAL_TABLET | Freq: Four times a day (QID) | ORAL | 1 refills | Status: DC | PRN

## 2016-09-25 MED ORDER — CEFAZOLIN SODIUM-DEXTROSE 2-4 GM/100ML-% IV SOLN
INTRAVENOUS | Status: AC
  Filled 2016-09-25: qty 100

## 2016-09-25 MED ORDER — CHLORHEXIDINE GLUCONATE CLOTH 2 % EX PADS: 6.0000 | MEDICATED_PAD | Freq: Once | CUTANEOUS | Status: DC

## 2016-09-25 MED ORDER — BUPIVACAINE-EPINEPHRINE (PF) 0.25% -1:200000 IJ SOLN
INTRAMUSCULAR | Status: AC
  Filled 2016-09-25: qty 30

## 2016-09-25 MED ORDER — 0.9 % SODIUM CHLORIDE (POUR BTL) OPTIME
TOPICAL | Status: DC | PRN
  Administered 2016-09-25: 1000 mL

## 2016-09-25 MED ORDER — SUGAMMADEX SODIUM 200 MG/2ML IV SOLN
INTRAVENOUS | Status: AC
Start: 2016-09-25 — End: 2016-09-25
  Filled 2016-09-25: qty 2

## 2016-09-25 MED ORDER — SCOPOLAMINE 1 MG/3DAYS TD PT72
1.0000 | MEDICATED_PATCH | TRANSDERMAL | Status: DC
  Administered 2016-09-25: 1.5 mg via TRANSDERMAL

## 2016-09-25 MED ORDER — KETOROLAC TROMETHAMINE 30 MG/ML IJ SOLN
INTRAMUSCULAR | Status: AC
  Filled 2016-09-25: qty 1

## 2016-09-25 MED ORDER — PROPOFOL 10 MG/ML IV BOLUS
INTRAVENOUS | Status: DC | PRN
  Administered 2016-09-25: 150 mg via INTRAVENOUS

## 2016-09-25 MED ORDER — DEXAMETHASONE SODIUM PHOSPHATE 10 MG/ML IJ SOLN
INTRAMUSCULAR | Status: DC | PRN
  Administered 2016-09-25: 10 mg via INTRAVENOUS

## 2016-09-25 MED ORDER — MIDAZOLAM HCL 5 MG/5ML IJ SOLN
INTRAMUSCULAR | Status: DC | PRN
  Administered 2016-09-25: 2 mg via INTRAVENOUS

## 2016-09-25 MED ORDER — FENTANYL CITRATE (PF) 100 MCG/2ML IJ SOLN
25.0000 ug | INTRAMUSCULAR | Status: DC | PRN
  Administered 2016-09-25: 0.5 ug via INTRAVENOUS

## 2016-09-25 MED ORDER — BUPIVACAINE-EPINEPHRINE 0.25% -1:200000 IJ SOLN
INTRAMUSCULAR | Status: DC | PRN
  Administered 2016-09-25: 20 mL

## 2016-09-25 MED ORDER — KETOROLAC TROMETHAMINE 30 MG/ML IJ SOLN
INTRAMUSCULAR | Status: DC | PRN
  Administered 2016-09-25: 30 mg via INTRAVENOUS

## 2016-09-25 MED ORDER — SCOPOLAMINE 1 MG/3DAYS TD PT72
MEDICATED_PATCH | TRANSDERMAL | Status: AC
  Filled 2016-09-25: qty 1

## 2016-09-25 MED ORDER — DEXAMETHASONE SODIUM PHOSPHATE 10 MG/ML IJ SOLN
INTRAMUSCULAR | Status: AC
  Filled 2016-09-25: qty 1

## 2016-09-25 MED ORDER — LIDOCAINE HCL (CARDIAC) 20 MG/ML IV SOLN
INTRAVENOUS | Status: DC | PRN
  Administered 2016-09-25: 80 mg via INTRAVENOUS

## 2016-09-25 MED ORDER — ONDANSETRON HCL 4 MG/2ML IJ SOLN
INTRAMUSCULAR | Status: DC | PRN
  Administered 2016-09-25: 4 mg via INTRAVENOUS

## 2016-09-25 MED ORDER — ROCURONIUM BROMIDE 100 MG/10ML IV SOLN
INTRAVENOUS | Status: DC | PRN
  Administered 2016-09-25: 30 mg via INTRAVENOUS

## 2016-09-25 MED ORDER — PROMETHAZINE HCL 25 MG/ML IJ SOLN: 6.2500 mg | INTRAMUSCULAR | Status: DC | PRN

## 2016-09-25 MED ORDER — LACTATED RINGERS IV SOLN
INTRAVENOUS | Status: DC
  Administered 2016-09-25 (×2): via INTRAVENOUS

## 2016-09-25 MED ORDER — ONDANSETRON HCL 4 MG/2ML IJ SOLN
INTRAMUSCULAR | Status: AC
  Filled 2016-09-25: qty 2

## 2016-09-25 SURGICAL SUPPLY — 37 items
ADH SKN CLS APL DERMABOND .7 (GAUZE/BANDAGES/DRESSINGS) ×1
APPLIER CLIP 5 13 M/L LIGAMAX5 (MISCELLANEOUS) ×3
APR CLP MED LRG 5 ANG JAW (MISCELLANEOUS) ×1
BAG SPEC RTRVL LRG 6X4 10 (ENDOMECHANICALS) ×1
CANISTER SUCTION 2500CC (MISCELLANEOUS) ×3 IMPLANT
CHLORAPREP W/TINT 26ML (MISCELLANEOUS) ×3 IMPLANT
CLIP APPLIE 5 13 M/L LIGAMAX5 (MISCELLANEOUS) ×1 IMPLANT
COVER SURGICAL LIGHT HANDLE (MISCELLANEOUS) ×3 IMPLANT
DERMABOND ADVANCED (GAUZE/BANDAGES/DRESSINGS) ×2
DERMABOND ADVANCED .7 DNX12 (GAUZE/BANDAGES/DRESSINGS) IMPLANT
ELECT REM PT RETURN 9FT ADLT (ELECTROSURGICAL) ×3
ELECTRODE REM PT RTRN 9FT ADLT (ELECTROSURGICAL) ×1 IMPLANT
GLOVE BIOGEL PI IND STRL 6.5 (GLOVE) IMPLANT
GLOVE BIOGEL PI INDICATOR 6.5 (GLOVE) ×4
GLOVE SURG SIGNA 7.5 PF LTX (GLOVE) ×3 IMPLANT
GLOVE SURG SS PI 6.5 STRL IVOR (GLOVE) ×2 IMPLANT
GOWN STRL REUS W/ TWL LRG LVL3 (GOWN DISPOSABLE) ×2 IMPLANT
GOWN STRL REUS W/ TWL XL LVL3 (GOWN DISPOSABLE) ×1 IMPLANT
GOWN STRL REUS W/TWL LRG LVL3 (GOWN DISPOSABLE) ×6
GOWN STRL REUS W/TWL XL LVL3 (GOWN DISPOSABLE) ×3
KIT BASIN OR (CUSTOM PROCEDURE TRAY) ×3 IMPLANT
KIT ROOM TURNOVER OR (KITS) ×3 IMPLANT
LIQUID BAND (GAUZE/BANDAGES/DRESSINGS) ×3 IMPLANT
NS IRRIG 1000ML POUR BTL (IV SOLUTION) ×3 IMPLANT
PAD ARMBOARD 7.5X6 YLW CONV (MISCELLANEOUS) ×3 IMPLANT
POUCH SPECIMEN RETRIEVAL 10MM (ENDOMECHANICALS) ×3 IMPLANT
SCISSORS LAP 5X35 DISP (ENDOMECHANICALS) ×3 IMPLANT
SET IRRIG TUBING LAPAROSCOPIC (IRRIGATION / IRRIGATOR) ×3 IMPLANT
SLEEVE ENDOPATH XCEL 5M (ENDOMECHANICALS) ×6 IMPLANT
SPECIMEN JAR SMALL (MISCELLANEOUS) ×3 IMPLANT
SUT MON AB 4-0 PC3 18 (SUTURE) ×3 IMPLANT
TOWEL OR 17X24 6PK STRL BLUE (TOWEL DISPOSABLE) ×3 IMPLANT
TOWEL OR 17X26 10 PK STRL BLUE (TOWEL DISPOSABLE) ×3 IMPLANT
TRAY LAPAROSCOPIC MC (CUSTOM PROCEDURE TRAY) ×3 IMPLANT
TROCAR XCEL BLUNT TIP 100MML (ENDOMECHANICALS) ×3 IMPLANT
TROCAR XCEL NON-BLD 5MMX100MML (ENDOMECHANICALS) ×3 IMPLANT
TUBING INSUFFLATION (TUBING) ×3 IMPLANT

## 2016-09-25 NOTE — Anesthesia Postprocedure Evaluation (Signed)
Anesthesia Post Note  Patient: Lisa Fitzpatrick  Procedure(s) Performed: Procedure(s) (LRB): LAPAROSCOPIC CHOLECYSTECTOMY (N/A)  Patient location during evaluation: PACU Anesthesia Type: General Level of consciousness: awake and alert Pain management: pain level controlled Vital Signs Assessment: post-procedure vital signs reviewed and stable Respiratory status: spontaneous breathing, nonlabored ventilation and respiratory function stable Cardiovascular status: blood pressure returned to baseline and stable Postop Assessment: no signs of nausea or vomiting Anesthetic complications: no    Last Vitals:  Vitals:   09/25/16 1430 09/25/16 1445  BP: 115/66 108/74  Pulse: 78 63  Resp: 13 16  Temp:      Last Pain:  Vitals:   09/25/16 1445  TempSrc:   PainSc: 7                  Linton RumpJennifer Dickerson Kean Gautreau

## 2016-09-25 NOTE — Discharge Instructions (Signed)
[facility] ______[facility] LAPAROSCOPIC SURGERY: POST OP INSTRUCTIONS Always review your discharge instruction sheet given to you by the facility where your surgery was performed. IF YOU HAVE DISABILITY OR FAMILY LEAVE FORMS, YOU MUST BRING THEM TO THE OFFICE FOR PROCESSING.   DO NOT GIVE THEM TO YOUR DOCTOR.  1. A prescription for pain medication may be given to you upon discharge.  Take your pain medication as prescribed, if needed.  If narcotic pain medicine is not needed, then you may take acetaminophen (Tylenol) or ibuprofen (Advil) as needed. 2. Take your usually prescribed medications unless otherwise directed. 3. If you need a refill on your pain medication, please contact your pharmacy.  They will contact our office to request authorization. Prescriptions will not be filled after 5pm or on week-ends. 4. You should follow a light diet the first few days after arrival home, such as soup and crackers, etc.  Be sure to include lots of fluids daily. 5. Most patients will experience some swelling and bruising in the area of the incisions.  Ice packs will help.  Swelling and bruising can take several days to resolve.  6. It is common to experience some constipation if taking pain medication after surgery.  Increasing fluid intake and taking a stool softener (such as Colace) will usually help or prevent this problem from occurring.  A mild laxative (Milk of Magnesia or Miralax) should be taken according to package instructions if there are no bowel movements after 48 hours. 7. Unless discharge instructions indicate otherwise, you may remove your bandages 24-48 hours after surgery, and you may shower at that time.  You may have steri-strips (small skin tapes) in place directly over the incision.  These strips should be left on the skin for 7-10 days.  If your surgeon used skin glue on the incision, you may shower in 24 hours.  The glue will flake off over the next 2-3 weeks.  Any sutures or  staples will be removed at the office during your follow-up visit. 8. ACTIVITIES:  You may resume regular (light) daily activities beginning the next day--such as daily self-care, walking, climbing stairs--gradually increasing activities as tolerated.  You may have sexual intercourse when it is comfortable.  Refrain from any heavy lifting or straining until approved by your doctor. a. You may drive when you are no longer taking prescription pain medication, you can comfortably wear a seatbelt, and you can safely maneuver your car and apply brakes. b. RETURN TO WORK:  ___WHEN YOU FEEL COMFORTABLE WITH WORKING.  POTENTIALLY 1 TO 2 WEEKS______________________________________________________ 9. You should see your doctor in the office for a follow-up appointment approximately 2-3 weeks after your surgery.  Make sure that you call for this appointment within a day or two after you arrive home to insure a convenient appointment time. 10. OTHER INSTRUCTIONS:NO LIFTING MORE THAN 15 POUNDS FOR 2 WEEKS 11. OK TO SHOWER TOMORROW 12. ICE PACK AND IBUPROFEN ALSO FOR PAIN __________________________________________________________________________________________________________________________ __________________________________________________________________________________________________________________________ WHEN TO CALL YOUR DOCTOR: 1. Fever over 101.0 2. Inability to urinate 3. Continued bleeding from incision. 4. Increased pain, redness, or drainage from the incision. 5. Increasing abdominal pain  The clinic staff is available to answer your questions during regular business hours.  Please dont hesitate to call and ask to speak to one of the nurses for clinical concerns.  If you have a medical emergency, go to the nearest emergency room or call 911.  A surgeon from [facility] is always on call  at the hospital. [address], [address], [place], [place]  [address] ? [address] [address],  [place], [place]   [address] [phone] ? [phone] ? FAX [phone] Web site: [email or web address]

## 2016-09-25 NOTE — Progress Notes (Signed)
Wasted 50mcg of Fentanyl in sharps. Witnessed by Onalee Huaavid RD

## 2016-09-25 NOTE — Progress Notes (Signed)
Report given to jena rn as caregiver 

## 2016-09-25 NOTE — Anesthesia Procedure Notes (Signed)
Procedure Name: Intubation Date/Time: 09/25/2016 1:34 PM Performed by: Arlice ColtMANESS, Conner Muegge B Pre-anesthesia Checklist: Patient identified, Emergency Drugs available, Suction available, Patient being monitored and Timeout performed Patient Re-evaluated:Patient Re-evaluated prior to inductionOxygen Delivery Method: Circle system utilized Preoxygenation: Pre-oxygenation with 100% oxygen Intubation Type: IV induction Ventilation: Mask ventilation without difficulty Laryngoscope Size: Mac and 3 Grade View: Grade I Tube type: Oral Tube size: 7.0 mm Number of attempts: 1 Airway Equipment and Method: Stylet Placement Confirmation: ETT inserted through vocal cords under direct vision,  positive ETCO2 and breath sounds checked- equal and bilateral Secured at: 20 cm Tube secured with: Tape Dental Injury: Teeth and Oropharynx as per pre-operative assessment

## 2016-09-25 NOTE — Transfer of Care (Signed)
Immediate Anesthesia Transfer of Care Note  Patient: Lisa Fitzpatrick  Procedure(s) Performed: Procedure(s): LAPAROSCOPIC CHOLECYSTECTOMY (N/A)  Patient Location: PACU  Anesthesia Type:General  Level of Consciousness: awake, alert  and oriented  Airway & Oxygen Therapy: Patient Spontanous Breathing  Post-op Assessment: Report given to RN and Post -op Vital signs reviewed and stable  Post vital signs: Reviewed and stable  Last Vitals:  Vitals:   09/25/16 1125  BP: (!) 102/53  Pulse: (!) 51  Resp: 18  Temp: 36.8 C    Last Pain:  Vitals:   09/25/16 1125  TempSrc: Oral      Patients Stated Pain Goal: 3 (09/25/16 1117)  Complications: No apparent anesthesia complications

## 2016-09-25 NOTE — Op Note (Signed)
Laparoscopic Cholecystectomy Procedure Note  Indications: This patient presents with symptomatic gallbladder disease and will undergo laparoscopic cholecystectomy.  Pre-operative Diagnosis: symptomatic cholelithiasis  Post-operative Diagnosis: Same  Surgeon: Abigail MiyamotoBLACKMAN,Joas Motton A   Assistants: 0  Anesthesia: General endotracheal anesthesia  ASA Class: 1  Procedure Details  The patient was seen again in the Holding Room. The risks, benefits, complications, treatment options, and expected outcomes were discussed with the patient. The possibilities of reaction to medication, pulmonary aspiration, perforation of viscus, bleeding, recurrent infection, finding a normal gallbladder, the need for additional procedures, failure to diagnose a condition, the possible need to convert to an open procedure, and creating a complication requiring transfusion or operation were discussed with the patient. The likelihood of improving the patient's symptoms with return to their baseline status is good.  The patient and/or family concurred with the proposed plan, giving informed consent. The site of surgery properly noted. The patient was taken to Operating Room, identified as Lisa Fitzpatrick and the procedure verified as Laparoscopic Cholecystectomy with Intraoperative Cholangiogram. A Time Out was held and the above information confirmed.  Prior to the induction of general anesthesia, antibiotic prophylaxis was administered. General endotracheal anesthesia was then administered and tolerated well. After the induction, the abdomen was prepped with Chloraprep and draped in sterile fashion. The patient was positioned in the supine position.  Local anesthetic agent was injected into the skin near the umbilicus and an incision made. We dissected down to the abdominal fascia with blunt dissection.  The fascia was incised vertically and we entered the peritoneal cavity bluntly.  A pursestring suture of 0-Vicryl was placed  around the fascial opening.  The Hasson cannula was inserted and secured with the stay suture.  Pneumoperitoneum was then created with CO2 and tolerated well without any adverse changes in the patient's vital signs. A 5-mm port was placed in the subxiphoid position.  Two 5-mm ports were placed in the right upper quadrant. All skin incisions were infiltrated with a local anesthetic agent before making the incision and placing the trocars.   We positioned the patient in reverse Trendelenburg, tilted slightly to the patient's left.  The gallbladder was identified, the fundus grasped and retracted cephalad. Adhesions were lysed bluntly and with the electrocautery where indicated, taking care not to injure any adjacent organs or viscus. The infundibulum was grasped and retracted laterally, exposing the peritoneum overlying the triangle of Calot. This was then divided and exposed in a blunt fashion. The cystic duct was clearly identified and bluntly dissected circumferentially. A critical view of the cystic duct and cystic artery was obtained.  The cystic duct was then ligated with clips and divided. The cystic artery was, dissected free, ligated with clips and divided as well.   The gallbladder was dissected from the liver bed in retrograde fashion with the electrocautery. The gallbladder was removed and placed in an Endocatch sac. The liver bed was irrigated and inspected. Hemostasis was achieved with the electrocautery. Copious irrigation was utilized and was repeatedly aspirated until clear.  The gallbladder and Endocatch sac were then removed through the umbilical port site.  The pursestring suture was used to close the umbilical fascia.    We again inspected the right upper quadrant for hemostasis.  Pneumoperitoneum was released as we removed the trocars.  4-0 Monocryl was used to close the skin.  Skin glue was then applied. The patient was then extubated and brought to the recovery room in stable condition.  Instrument, sponge, and needle counts were correct  at closure and at the conclusion of the case.   Findings: Chronic Cholecystitis with Cholelithiasis  Estimated Blood Loss: Minimal         Drains: 0         Specimens: Gallbladder           Complications: None; patient tolerated the procedure well.         Disposition: PACU - hemodynamically stable.         Condition: stable

## 2016-09-25 NOTE — Interval H&P Note (Signed)
History and Physical Interval Note:no change in H and P  09/25/2016 11:24 AM  Lisa SettleEryn Lake BellsM Fitzpatrick  has presented today for surgery, with the diagnosis of gallstones  The various methods of treatment have been discussed with the patient and family. After consideration of risks, benefits and other options for treatment, the patient has consented to  Procedure(s): LAPAROSCOPIC CHOLECYSTECTOMY (N/A) as a surgical intervention .  The patient's history has been reviewed, patient examined, no change in status, stable for surgery.  I have reviewed the patient's chart and labs.  Questions were answered to the patient's satisfaction.     Solina Heron A

## 2016-09-26 ENCOUNTER — Encounter (HOSPITAL_COMMUNITY): Payer: Self-pay | Admitting: Surgery

## 2016-11-16 IMAGING — CT CT ABD-PELV W/ CM
2 of 4 series · 15 of 46 positions shown, 17 images · IV contrast (ISOVUE)
Comparison: None available

CLINICAL DATA: Abdominal pain and fever since yesterday with
shortness of breath.

EXAM:
CT ABDOMEN AND PELVIS WITH CONTRAST
TECHNIQUE: Multidetector CT imaging of the abdomen and pelvis was performed
using the standard protocol following bolus administration of
intravenous contrast.
CONTRAST:  100mL [id number] IOPAMIDOL ([id number]) INJECTION 61%

[Series 2: abd/pel with · axial · 0.74mm/px · z∈[+813,+1273]mm · 12 of 104 slices shown, 14 images]
[im 6/104  soft-tissue]
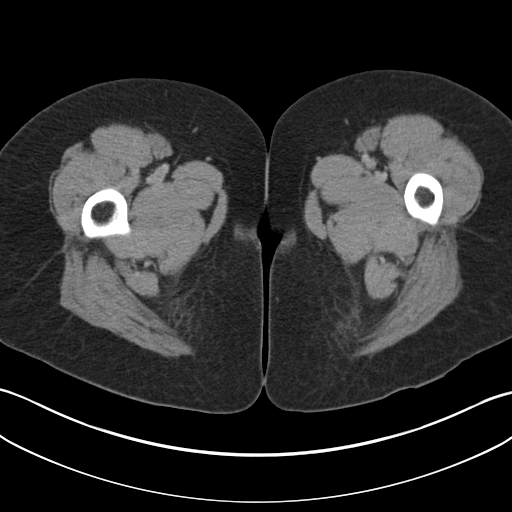
[im 6/104  bone]
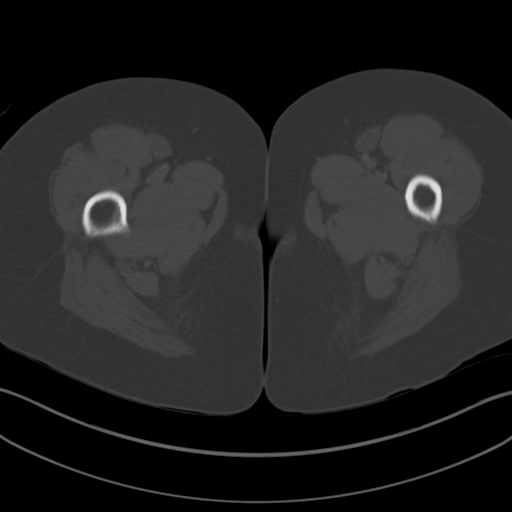
[im 16/104  soft-tissue]
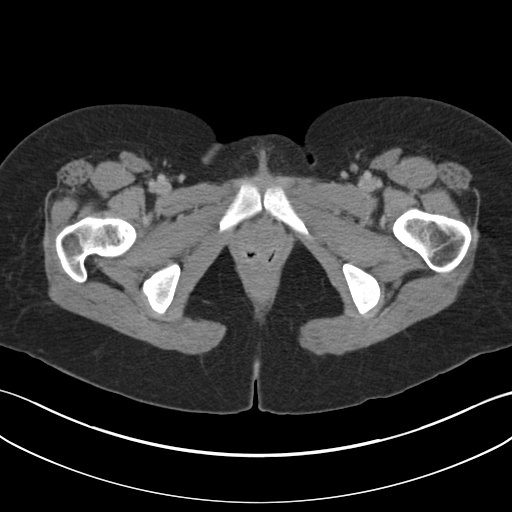
[im 21/104  soft-tissue]
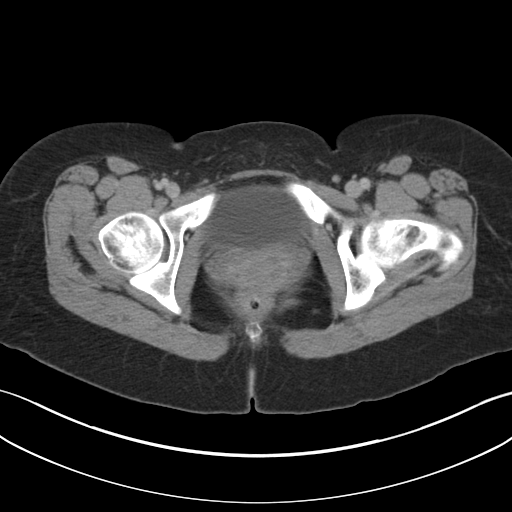
[im 31/104  soft-tissue]
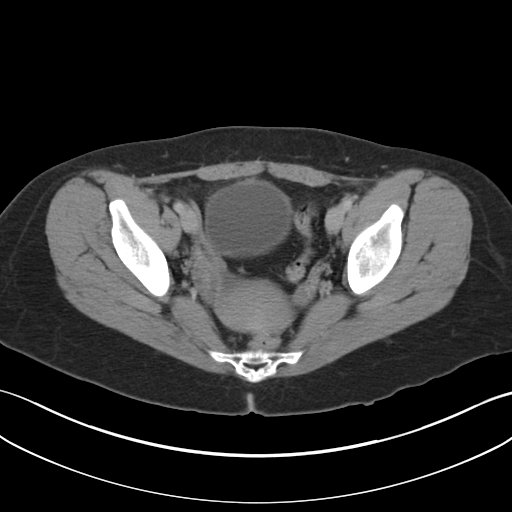
[im 42/104  soft-tissue]
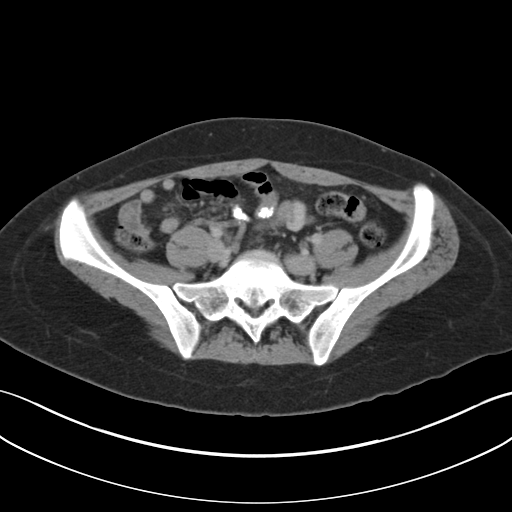
[im 47/104  soft-tissue]
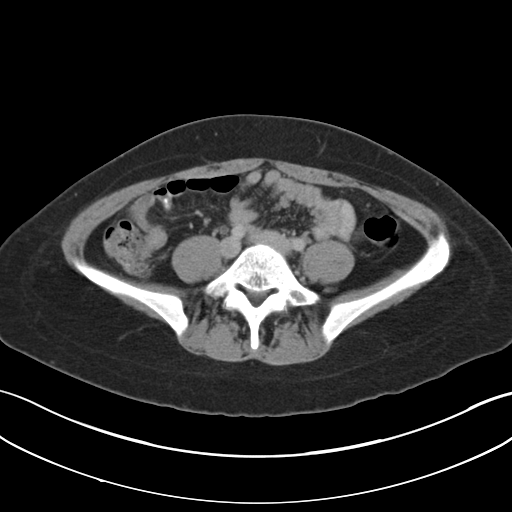
[im 57/104  soft-tissue]
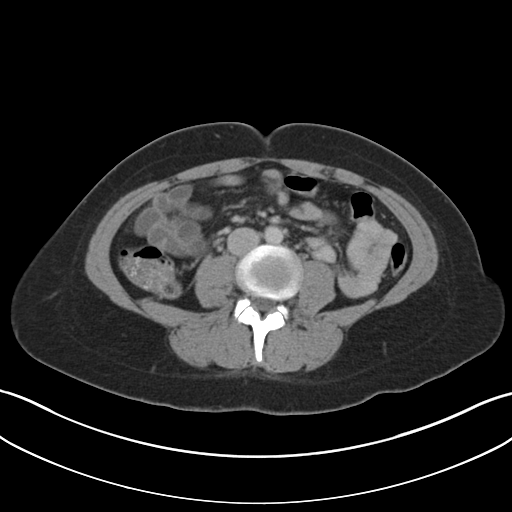
[im 62/104  soft-tissue]
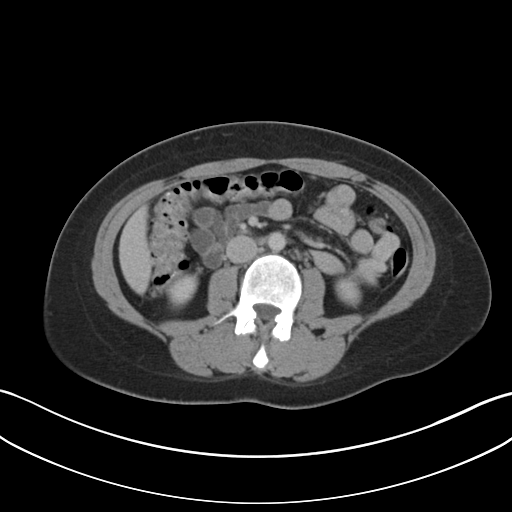
[im 73/104  soft-tissue]
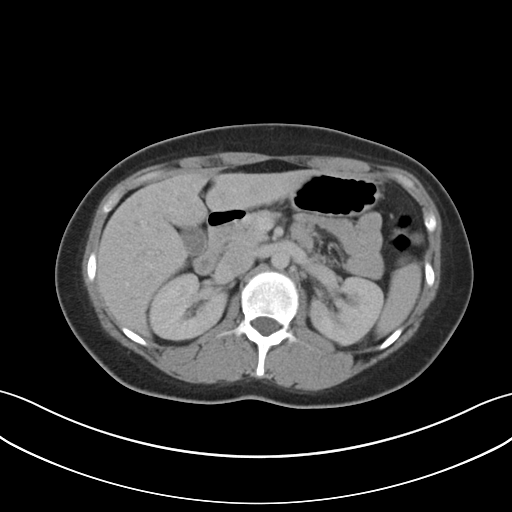
[im 73/104  bone]
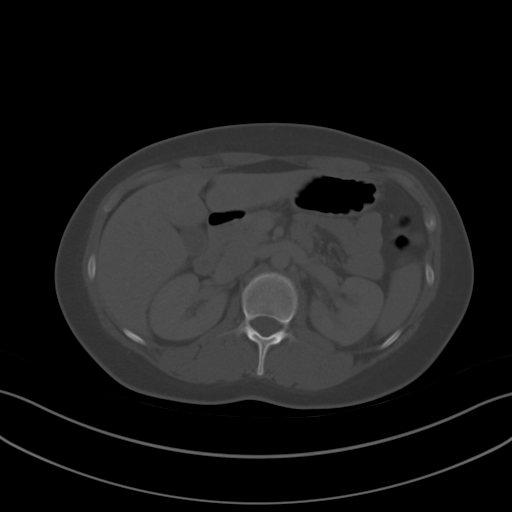
[im 83/104  soft-tissue]
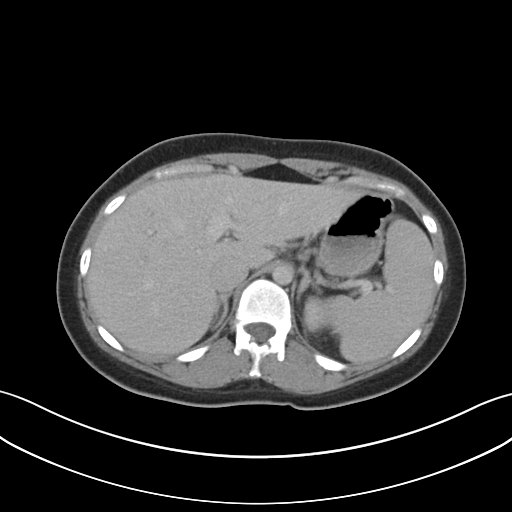
[im 88/104  soft-tissue]
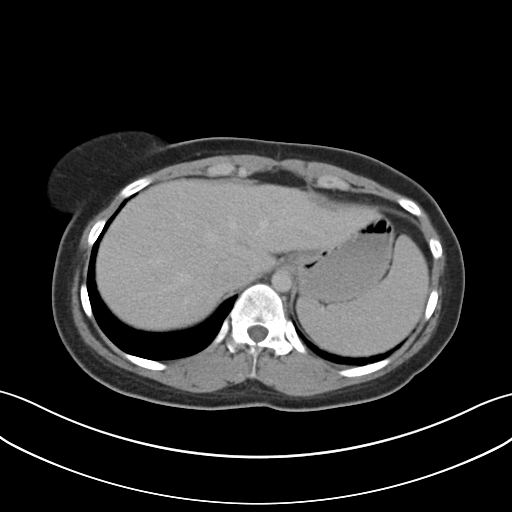
[im 98/104  soft-tissue]
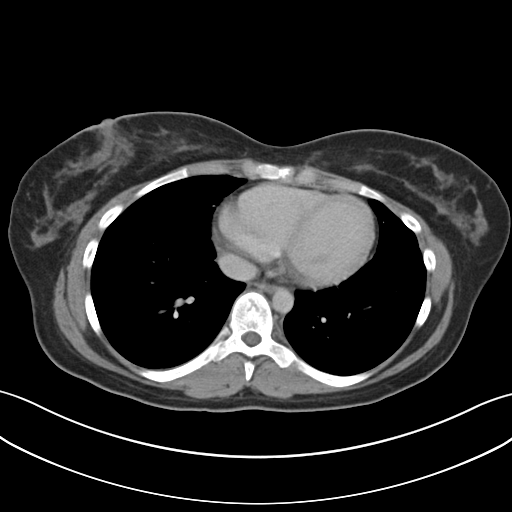

[Series 5: coronal a/|p · coronal · 0.68mm/px · 3 of 113 slices shown]
[im 38/113  soft-tissue]
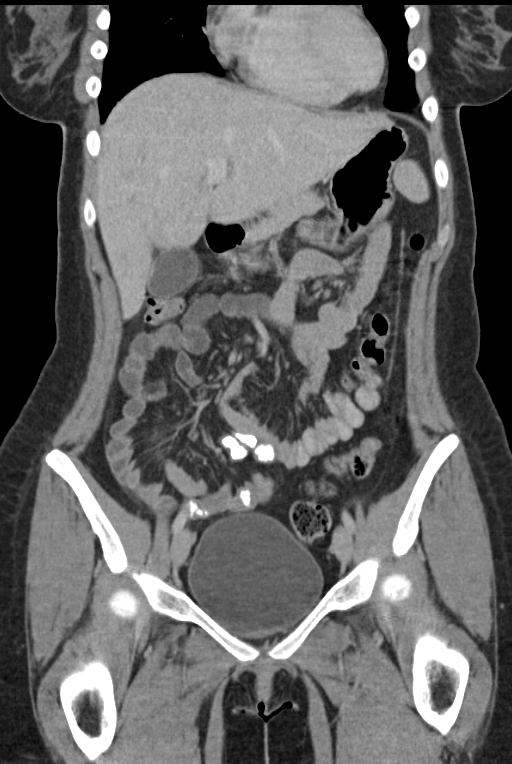
[im 50/113  soft-tissue]
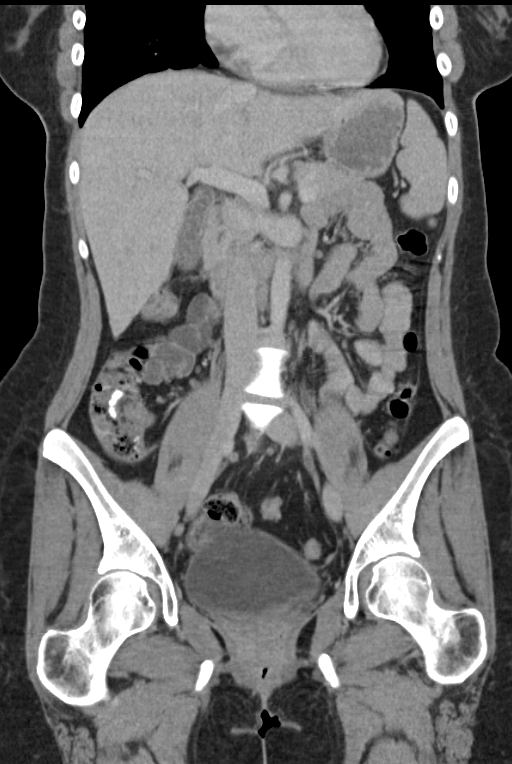
[im 63/113  soft-tissue]
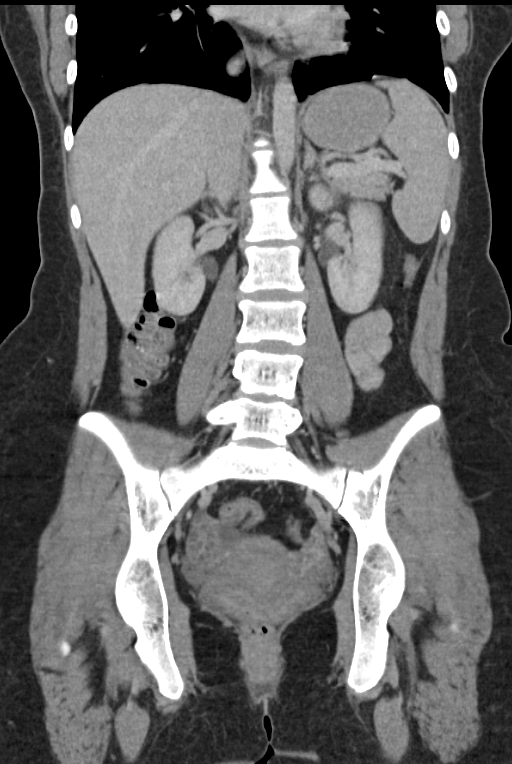

[15 of 46 positions shown; findings below may reference images not displayed]

FINDINGS: Lower chest:  No acute findings.

Hepatobiliary: Nonspecific mild periportal edema throughout the
liver. Small layering gallstones noted within the gallbladder. No
gallbladder distention or inflammatory process. No biliary
dilatation. Hepatic and portal veins are patent. No focal hepatic
abnormality.

Pancreas: No mass, inflammatory changes, or other significant
abnormality.

Spleen: Within normal limits in size and appearance.

Adrenals/Urinary Tract: Normal adrenal glands. Minimal striations to
the left kidney nephrogram posteriorly in the upper pole, images 25
through 28. This is a subtle finding but suggest ascending urinary
tract infection or pyelonephritis. Right kidney has a normal
enhancement pattern. No renal obstruction or hydronephrosis. No
obstructing ureteral calculus on either side. Urinary bladder
unremarkable.

Stomach/Bowel: Negative for bowel obstruction, significant
dilatation, ileus, or free air. Hyperdense bowel content in the
right lower quadrant within small bowel and in the cecum. Normal
appendix. No fluid collection or abscess.

Vascular/Lymphatic: No pathologically enlarged lymph nodes. No
evidence of abdominal aortic aneurysm.

Reproductive: No mass or other significant abnormality.

Other: No inguinal abnormality or hernia.  Intact abdominal wall.

Musculoskeletal:  No acute osseous finding.
IMPRESSION: Mild left kidney upper pole striated nephrogram suggest mild
pyelonephritis.

Nonspecific periportal edema within the liver

Incidental cholelithiasis.  no biliary dilatation or obstruction.

## 2017-01-05 ENCOUNTER — Emergency Department (HOSPITAL_COMMUNITY)
Admission: EM | Admit: 2017-01-05 | Discharge: 2017-01-06 | Disposition: A | Discharge status: Other Acute Inpatient Hospital | Payer: 59 | Attending: Emergency Medicine | Admitting: Emergency Medicine

## 2017-01-05 ENCOUNTER — Encounter (HOSPITAL_COMMUNITY): Payer: Self-pay | Admitting: *Deleted

## 2017-01-05 DIAGNOSIS — F1210 Cannabis abuse, uncomplicated: Secondary | ICD-10-CM | POA: Insufficient documentation

## 2017-01-05 DIAGNOSIS — Z79899 Other long term (current) drug therapy: Secondary | ICD-10-CM | POA: Diagnosis not present

## 2017-01-05 DIAGNOSIS — Z87891 Personal history of nicotine dependence: Secondary | ICD-10-CM | POA: Insufficient documentation

## 2017-01-05 DIAGNOSIS — F339 Major depressive disorder, recurrent, unspecified: Secondary | ICD-10-CM | POA: Insufficient documentation

## 2017-01-05 DIAGNOSIS — F1910 Other psychoactive substance abuse, uncomplicated: Secondary | ICD-10-CM

## 2017-01-05 DIAGNOSIS — F3130 Bipolar disorder, current episode depressed, mild or moderate severity, unspecified: Secondary | ICD-10-CM | POA: Diagnosis present

## 2017-01-05 DIAGNOSIS — R45851 Suicidal ideations: Secondary | ICD-10-CM | POA: Diagnosis present

## 2017-01-05 DIAGNOSIS — F314 Bipolar disorder, current episode depressed, severe, without psychotic features: Secondary | ICD-10-CM

## 2017-01-05 LAB — COMPREHENSIVE METABOLIC PANEL
ALBUMIN: 4.5 g/dL (ref 3.5–5.0)
ALT: 12 U/L — AB (ref 14–54)
AST: 18 U/L (ref 15–41)
Alkaline Phosphatase: 88 U/L (ref 38–126)
Anion gap: 6 (ref 5–15)
BILIRUBIN TOTAL: 0.4 mg/dL (ref 0.3–1.2)
BUN: 19 mg/dL (ref 6–20)
CO2: 27 mmol/L (ref 22–32)
CREATININE: 0.68 mg/dL (ref 0.44–1.00)
Calcium: 9.3 mg/dL (ref 8.9–10.3)
Chloride: 102 mmol/L (ref 101–111)
GFR calc Af Amer: >60 mL/min (ref >60)
GLUCOSE: 98 mg/dL (ref 65–99)
Potassium: 3.8 mmol/L (ref 3.5–5.1)
Sodium: 135 mmol/L (ref 135–145)
TOTAL PROTEIN: 8 g/dL (ref 6.5–8.1)

## 2017-01-05 LAB — SALICYLATE LEVEL: Salicylate Lvl: <7 mg/dL (ref 2.8–30.0)

## 2017-01-05 LAB — CBC
HEMATOCRIT: 40.8 % (ref 36.0–46.0)
Hemoglobin: 13.5 g/dL (ref 12.0–15.0)
MCH: 27.3 pg (ref 26.0–34.0)
MCHC: 33.1 g/dL (ref 30.0–36.0)
MCV: 82.4 fL (ref 78.0–100.0)
Platelets: 273 10*3/uL (ref 150–400)
RBC: 4.95 MIL/uL (ref 3.87–5.11)
RDW: 13.3 % (ref 11.5–15.5)
WBC: 9.8 10*3/uL (ref 4.0–10.5)

## 2017-01-05 LAB — ACETAMINOPHEN LEVEL: Acetaminophen (Tylenol), Serum: <10 ug/mL — ABNORMAL LOW (ref 10–30)

## 2017-01-05 LAB — ETHANOL: Alcohol, Ethyl (B): <5 mg/dL (ref <5)

## 2017-01-05 NOTE — ED Notes (Signed)
Bed: WA28 Expected date:  Expected time:  Means of arrival:  Comments: 

## 2017-01-05 NOTE — ED Notes (Signed)
Pt stated "I haven't eaten since 0800 this morning."  Pt provided mac n' cheese, [place] sandwich & ginger ale.

## 2017-01-05 NOTE — ED Triage Notes (Signed)
Pt brought in by [employer] under IVC.  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  Pt stated "my mom had me committed because she found some weed in my car and went crazy."

## 2017-01-06 ENCOUNTER — Inpatient Hospital Stay (HOSPITAL_COMMUNITY)
Admission: AD | Admit: 2017-01-06 | Discharge: 2017-01-09 | DRG: 885 | Disposition: A | Discharge status: Home / Self Care | Payer: 59 | Attending: Psychiatry | Admitting: Psychiatry

## 2017-01-06 ENCOUNTER — Encounter (HOSPITAL_COMMUNITY): Payer: Self-pay | Admitting: *Deleted

## 2017-01-06 DIAGNOSIS — Z9889 Other specified postprocedural states: Secondary | ICD-10-CM | POA: Diagnosis not present

## 2017-01-06 DIAGNOSIS — F339 Major depressive disorder, recurrent, unspecified: Secondary | ICD-10-CM | POA: Diagnosis not present

## 2017-01-06 DIAGNOSIS — Z915 Personal history of self-harm: Secondary | ICD-10-CM | POA: Diagnosis not present

## 2017-01-06 DIAGNOSIS — R45851 Suicidal ideations: Secondary | ICD-10-CM | POA: Diagnosis present

## 2017-01-06 DIAGNOSIS — F319 Bipolar disorder, unspecified: Principal | ICD-10-CM | POA: Diagnosis present

## 2017-01-06 DIAGNOSIS — F1220 Cannabis dependence, uncomplicated: Secondary | ICD-10-CM | POA: Diagnosis not present

## 2017-01-06 DIAGNOSIS — Z87891 Personal history of nicotine dependence: Secondary | ICD-10-CM

## 2017-01-06 DIAGNOSIS — Z9049 Acquired absence of other specified parts of digestive tract: Secondary | ICD-10-CM

## 2017-01-06 DIAGNOSIS — F3130 Bipolar disorder, current episode depressed, mild or moderate severity, unspecified: Secondary | ICD-10-CM | POA: Diagnosis not present

## 2017-01-06 DIAGNOSIS — Z8659 Personal history of other mental and behavioral disorders: Secondary | ICD-10-CM

## 2017-01-06 DIAGNOSIS — Z888 Allergy status to other drugs, medicaments and biological substances status: Secondary | ICD-10-CM | POA: Diagnosis not present

## 2017-01-06 LAB — RAPID URINE DRUG SCREEN, HOSP PERFORMED
Amphetamines: NOT DETECTED
BARBITURATES: NOT DETECTED
Benzodiazepines: NOT DETECTED
COCAINE: NOT DETECTED
Opiates: NOT DETECTED
Tetrahydrocannabinol: POSITIVE — AB

## 2017-01-06 LAB — POC URINE PREG, ED: PREG TEST UR: NEGATIVE

## 2017-01-06 MED ORDER — VENLAFAXINE HCL ER 75 MG PO CP24
75.0000 mg | ORAL_CAPSULE | Freq: Every day | ORAL | Status: DC
  Administered 2017-01-06: 75 mg via ORAL
  Filled 2017-01-06: qty 1

## 2017-01-06 MED ORDER — TRAZODONE HCL 50 MG PO TABS
50.0000 mg | ORAL_TABLET | Freq: Every day | ORAL | Status: DC
  Filled 2017-01-06 (×2): qty 1

## 2017-01-06 MED ORDER — HYDROXYZINE HCL 25 MG PO TABS
25.0000 mg | ORAL_TABLET | Freq: Four times a day (QID) | ORAL | Status: DC | PRN
  Administered 2017-01-07 – 2017-01-08 (×2): 25 mg via ORAL
  Filled 2017-01-06: qty 1
  Filled 2017-01-06: qty 20
  Filled 2017-01-06: qty 1

## 2017-01-06 MED ORDER — TRAZODONE HCL 100 MG PO TABS: 100.0000 mg | ORAL_TABLET | Freq: Every evening | ORAL | Status: DC | PRN

## 2017-01-06 MED ORDER — MAGNESIUM HYDROXIDE 400 MG/5ML PO SUSP: 30.0000 mL | Freq: Every day | ORAL | Status: DC | PRN

## 2017-01-06 MED ORDER — DICYCLOMINE HCL 20 MG PO TABS
20.0000 mg | ORAL_TABLET | Freq: Two times a day (BID) | ORAL | Status: DC | PRN
Start: 2017-01-06 — End: 2017-01-06

## 2017-01-06 MED ORDER — CARBAMAZEPINE 200 MG PO TABS: 200.0000 mg | ORAL_TABLET | Freq: Two times a day (BID) | ORAL | Status: DC

## 2017-01-06 MED ORDER — NICOTINE 21 MG/24HR TD PT24
21.0000 mg | MEDICATED_PATCH | Freq: Every day | TRANSDERMAL | Status: DC
  Filled 2017-01-06: qty 1

## 2017-01-06 MED ORDER — VENLAFAXINE HCL ER 75 MG PO CP24
75.0000 mg | ORAL_CAPSULE | Freq: Every day | ORAL | Status: DC
  Administered 2017-01-07 – 2017-01-09 (×3): 75 mg via ORAL
  Filled 2017-01-06 (×2): qty 1
  Filled 2017-01-06: qty 14
  Filled 2017-01-06: qty 1
  Filled 2017-01-06: qty 14
  Filled 2017-01-06: qty 1

## 2017-01-06 MED ORDER — PROPRANOLOL HCL 10 MG PO TABS
10.0000 mg | ORAL_TABLET | Freq: Every day | ORAL | Status: DC | PRN
  Filled 2017-01-06: qty 1

## 2017-01-06 MED ORDER — ALUM & MAG HYDROXIDE-SIMETH 200-200-20 MG/5ML PO SUSP: 30.0000 mL | ORAL | Status: DC | PRN

## 2017-01-06 MED ORDER — ARIPIPRAZOLE 2 MG PO TABS
2.0000 mg | ORAL_TABLET | Freq: Every day | ORAL | Status: DC
  Administered 2017-01-06 – 2017-01-09 (×4): 2 mg via ORAL
  Filled 2017-01-06 (×3): qty 1
  Filled 2017-01-06 (×2): qty 14
  Filled 2017-01-06 (×2): qty 1

## 2017-01-06 MED ORDER — CLOTRIMAZOLE 1 % EX CREA
1.0000 "application " | TOPICAL_CREAM | Freq: Two times a day (BID) | CUTANEOUS | Status: DC
  Administered 2017-01-06 – 2017-01-09 (×5): 1 via TOPICAL
  Filled 2017-01-06: qty 15

## 2017-01-06 MED ORDER — IBUPROFEN 600 MG PO TABS: 600.0000 mg | ORAL_TABLET | Freq: Four times a day (QID) | ORAL | Status: DC | PRN

## 2017-01-06 MED ORDER — ARIPIPRAZOLE 2 MG PO TABS
2.0000 mg | ORAL_TABLET | Freq: Every day | ORAL | Status: DC
  Filled 2017-01-06: qty 1

## 2017-01-06 NOTE — ED Notes (Signed)
Patient denies pain and is resting comfortably.  

## 2017-01-06 NOTE — ED Provider Notes (Signed)
[facility]-EMERGENCY DEPT Provider Note   CSN: [id number] Arrival date & time: 01/05/17  2143     History   Chief Complaint Chief Complaint  Patient presents with  . IVC  . Suicidal    HPI Lisa Fitzpatrick is a 21 y.o. female.  HPI   21 year old female with a history of bipolar, depression, substance abuse presents with concern for IVC placed on her by her mother after she had stolen morphine from a nursing home and per IVC told her mother she would kill herself.  Patient denies suicidal ideation, homicidal ideation and hallucinations.  Patient reports she has a hx of depression but has not been feeling depressed.  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 Reports they were for morphine. Patient reports she uses steal morphine, however did not report she did last night. Denies any current drug abuse. Denies suicidal ideation, however per IVC placed by her mother she had made suicidal statements.   Past Medical History:  Diagnosis Date  . Anxiety   . Bipolar disorder (HCC)   . Depression   . PONV (postoperative nausea and vomiting)     Patient Active Problem List   Diagnosis Date Noted  . Severe episode of recurrent major depressive disorder, without psychotic features (HCC)   . Bipolar 1 disorder, depressed (HCC) 08/19/2016  . Bipolar affective disorder, current episode depressed (HCC) 08/19/2016    Past Surgical History:  Procedure Laterality Date  . CHOLECYSTECTOMY N/A 09/25/2016   Procedure: LAPAROSCOPIC CHOLECYSTECTOMY;  Surgeon: Abigail Miyamotoouglas Blackman, MD;  Location: [facility] OR;  Service: General;  Laterality: N/A;  . WISDOM TOOTH EXTRACTION      OB History    No data available       Home Medications    Prior to Admission medications   Medication Sig Start Date End Date Taking? Authorizing Provider  amphetamine-dextroamphetamine (ADDERALL) 15 MG tablet Take 15 mg by mouth daily as needed (for attention).   Yes Historical Provider, MD    ARIPiprazole (ABILIFY) 2 MG tablet Take 1 tablet (2 mg total) by mouth daily. 08/24/16  Yes Shuvon B Rankin, NP  clotrimazole (LOTRIMIN) 1 % cream Apply 1 application topically 2 (two) times daily. To rash on shoulders.   Yes Historical Provider, MD  dicyclomine (BENTYL) 20 MG tablet Take 1 tablet (20 mg total) by mouth 2 (two) times daily as needed (abdominal pain). 07/22/16  Yes Ace GinsSerena Y Sam, PA-C  propranolol (INDERAL) 10 MG tablet Take 10 mg by mouth daily as needed (anxiety).   Yes Historical Provider, MD  traMADol (ULTRAM) 50 MG tablet Take 1-2 tablets (50-100 mg total) by mouth every 6 (six) hours as needed for moderate pain. 09/25/16  Yes Abigail Miyamotoouglas Blackman, MD  venlafaxine XR (EFFEXOR-XR) 75 MG 24 hr capsule Take 1 capsule (75 mg total) by mouth daily with breakfast. 08/24/16  Yes Shuvon B Rankin, NP    Family History No family history on file.  Social History Social History  Substance Use Topics  . Smoking status: Former Smoker    Years: 4.00    Quit date: 09/25/2014  . Smokeless tobacco: Never Used     Comment: Hookah   . Alcohol use Yes     Comment: occ     Allergies   Oxycodone-acetaminophen and Codeine   Review of Systems Review of Systems  Constitutional: Negative for fever.  HENT: Negative for sore throat.   Eyes: Negative for visual disturbance.  Respiratory: Negative for  cough and shortness of breath.   Cardiovascular: Negative for chest pain.  Gastrointestinal: Negative for abdominal pain.  Genitourinary: Negative for difficulty urinating.  Musculoskeletal: Negative for back pain and neck pain.  Skin: Negative for rash.  Neurological: Negative for syncope and headaches.  Psychiatric/Behavioral: Negative for self-injury and sleep disturbance.     Physical Exam Updated Vital Signs BP 102/71   Pulse 77   Temp 98.2 F (36.8 C) (Oral)   Resp 17   Ht 5\' 5"  (1.651 m)   Wt 160 lb (72.6 kg)   SpO2 100%   BMI 26.63 kg/m   Physical Exam  Constitutional:  She is oriented to person, place, and time. She appears well-developed and well-nourished. No distress.  HENT:  Head: Normocephalic and atraumatic.  Eyes: Conjunctivae and EOM are normal.  Neck: Normal range of motion.  Cardiovascular: Normal rate, regular rhythm, normal heart sounds and intact distal pulses.  Exam reveals no gallop and no friction rub.   No murmur heard. Pulmonary/Chest: Effort normal and breath sounds normal. No respiratory distress. She has no wheezes. She has no rales.  Abdominal: Soft. She exhibits no distension. There is no tenderness. There is no guarding.  Musculoskeletal: She exhibits no edema or tenderness.  Neurological: She is alert and oriented to person, place, and time.  Skin: Skin is warm and dry. No rash noted. She is not diaphoretic. No erythema.  Nursing note and vitals reviewed.    ED Treatments / Results  Labs (all labs ordered are listed, but only abnormal results are displayed) Labs Reviewed  COMPREHENSIVE METABOLIC PANEL - Abnormal; Notable for the following:       Result Value   ALT 12 (*)    All other components within normal limits  ACETAMINOPHEN LEVEL - Abnormal; Notable for the following:    Acetaminophen (Tylenol), Serum <10 (*)    All other components within normal limits  ETHANOL  SALICYLATE LEVEL  CBC  RAPID URINE DRUG SCREEN, HOSP PERFORMED  POC URINE PREG, ED    EKG  EKG Interpretation None       Radiology No results found.  Procedures Procedures (including critical care time)  Medications Ordered in ED Medications - No data to display   Initial Impression / Assessment and Plan / ED Course  I have reviewed the triage vital signs and the nursing notes.  Pertinent labs & imaging results that were available during my care of the patient were reviewed by me and considered in my medical decision making (see chart for details).  Clinical Course    21 year old female with a history of bipolar, depression, substance  abuse presents with concern for IVC placed on her by her mother after she had stolen morphine from a nursing home and per IVC told her mother she would kill herself.  Patient denies suicidal ideation, homicidal ideation and hallucinations. Labs are without significant findings, she is hemodynamically stable, medically cleared.  TTS consulted.   IVC was placed by mother. Patient to have psych reevaluation the morning.  Final Clinical Impressions(s) / ED Diagnoses   Final diagnoses:  Suicidal ideations, per mother's report  Polysubstance abuse  Episode of recurrent major depressive disorder, unspecified depression episode severity (HCC)    New Prescriptions New Prescriptions   No medications on file     Alvira Monday, MD 01/06/17 [phone]

## 2017-01-06 NOTE — Progress Notes (Signed)
Per Karleen HampshireSpencer, PA recommend A.M. Psych evaluation. Kemonie Cutillo K. Souleymane Saiki, LCAS-A, LPC-A, NCC  Counselor 01/06/2017 3:00 AM

## 2017-01-06 NOTE — Tx Team (Signed)
Initial Treatment Plan 01/06/2017 7:01 PM Lisa GlazeEryn M Fitzpatrick RUE:[id number]    PATIENT STRESSORS: Legal issue Marital or family conflict Occupational concerns   PATIENT STRENGTHS: Ability for insight Average or above average intelligence Communication skills General fund of knowledge Physical Health Supportive family/friends   PATIENT IDENTIFIED PROBLEMS: At risk for suicide  Anxiety  "Find a new job"  "New Housing"               DISCHARGE CRITERIA:  Ability to meet basic life and health needs Improved stabilization in mood, thinking, and/or behavior Motivation to continue treatment in a less acute level of care Need for constant or close observation no longer present Verbal commitment to aftercare and medication compliance  PRELIMINARY DISCHARGE PLAN: Outpatient therapy Return to previous living arrangement Return to previous work or school arrangements  PATIENT/FAMILY INVOLVEMENT: This treatment plan has been presented to and reviewed with the patient, Lisa Glazeryn M Fitzpatrick.  The patient and family have been given the opportunity to ask questions and make suggestions.  Carleene OverlieMiddleton, Divit Stipp P, RN 01/06/2017, 7:01 PM

## 2017-01-06 NOTE — BH Assessment (Signed)
[facility] Assessment Progress Note  Per Thedore MinsMojeed Akintayo, MD, this pt requires psychiatric hospitalization.  Berneice Heinrichina Tate, RN, [facility].  Pt presents under IVC , upheld by Dr Jannifer FranklinAkintayo, and [facility]  Pt's nurse, Angelique BlonderDenise, has been notified, and agrees to call report to [phone].  Pt is to be transported via [employer].   Doylene Canninghomas Nakyra Bourn, MA Triage Specialist [phone]

## 2017-01-06 NOTE — ED Notes (Signed)
TTS equipment in to room as requested.

## 2017-01-06 NOTE — ED Notes (Signed)
Report given to Poso Parkaroline, RN [phone]([id number])

## 2017-01-06 NOTE — Progress Notes (Signed)
Admission Note:  21 year old female who presents IVC, in no acute distress, for the treatment of SI.  Patient appears angry, agitated, and irritable on admission. Patient was cooperative with admission process. Patient was addiment about verbalizing that she does not need to be admitted to the hospital.  Patient reports, following a verbal altercation with her mother, "i told my mom that if she was sending me to prison that I was going to kill myself".  Patient denies this as an actual suicide statement and states "I was just trying to threaten my mother so that she would change her mind. I don't want to die".  Patient continues to deny SI and contracts for safety upon admission. Patient denies AVH.  Patient reports multiple stressors to include "I probably lost my job, I'm facing a felony, and my mom kicked me out of the house".  According to patient, her mom found "pot" in patient's car and out of anger, told individuals at patient's job that patient had stolen needles.  Patient reports that she stole the needles a year ago to sell because she needed the money.  Patient reports that since then, she has been doing good.  Patient reports upcoming court date 02/09/17.  Patient reports marijuana use every other day "If that".  Patient denies any other drug or alcohol use.  Patient reports past medical hx of a gall bladder removal.  Patient was living with her mother, prior to admission, but has since made arrangements to stay with a friend and his mother.  Patient identifies "friends" as her support system.  [facility] patient's goal was to "find a new job" and "new housing".  Skin was assessed and found to be clear of any abnormal marks apart from bruising on legs bilateral from "wheelchairs at work" and scratches on hand. Patient searched and no contraband found, POC and unit policies explained and understanding verbalized. Consents obtained.  Patient had no additional questions or concerns.

## 2017-01-06 NOTE — Progress Notes (Signed)
The patient attended the evening A.A. Meeting and was appropriate.  

## 2017-01-06 NOTE — ED Notes (Signed)
Breakfast meal provided for pt.  

## 2017-01-06 NOTE — BH Assessment (Signed)
Tele Assessment Note   Lisa Fitzpatrick is an 21 y.o. female, Caucasian who presents to [employer] via [employer], per IVC report from pt. RN: Pt brought in by [employer] under IVC.  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  Pt stated "my mom had me committed because she found some weed in my car and went crazy.". Patient states no primary concern, but notes hx. Of depression/bipolar. Patient states that she was IVC by parent dues to possessing marijuana in car. Patient states that she currently resides with mother and sleep has decreased lately with as much as 4 hours or less sleep per day, but per pt. Has been working extra shifts and works nights.  Patient denies current SI/ HI and AVH. Patient acknowledges hx. Of S.A. With Cannabis since age of 49 x 1 "G" or less every other day with last use on 01-05-17 with amount of less that 1 g. Patient has hx of inpatient psych care with last at [facility] in 2017 August for SI. Patient denies current outpatient but states eh had planned [facility] for S.A., mental health, and sex addiction followed by nutritionist due to multiple food allergies.  Patient is dressed in scrubs and is alert and oriented x4. Patient speech was within normal limits and motor behavior appeared normal. Patient thought process is coherent. Patient  does not appear to be responding to internal stimuli. Patient was cooperative throughout the assessment and states that she is agreeable to inpatient psychiatric treatment.   Diagnosis: Bipolar Disorder; Cannabis Use Disorder, Severe  Past Medical History:  Past Medical History:  Diagnosis Date  . Anxiety   . Bipolar disorder (HCC)   . Depression   . PONV (postoperative nausea and vomiting)     Past Surgical History:  Procedure Laterality Date  . CHOLECYSTECTOMY N/A 09/25/2016   Procedure: LAPAROSCOPIC CHOLECYSTECTOMY;  Surgeon: Abigail Miyamoto, MD;  Location: [facility] OR;  Service: General;   Laterality: N/A;  . WISDOM TOOTH EXTRACTION      Family History: No family history on file.  Social History:  reports that she quit smoking about 2 years ago. She quit after 4.00 years of use. She has never used smokeless tobacco. She reports that she drinks alcohol. She reports that she uses drugs, including Marijuana, about 7 times per week.  Additional Social History:  Alcohol / Drug Use Pain Medications: SEE MAR Prescriptions: SEE MAR Over the Counter: SEE MAR History of alcohol / drug use?: Yes Longest period of sobriety (when/how long): not specified Negative Consequences of Use: Financial, Legal, Personal relationships, Work / School Withdrawal Symptoms: Patient aware of relationship between substance abuse and physical/medical complications Substance #1 Name of Substance 1: cannabis 1 - Age of First Use: 17 1 - Amount (size/oz): "a G" 1 - Frequency: every couple of days 1 - Duration: years 1 - Last Use / Amount: 01-05-17,  less than a g  CIWA: CIWA-Ar BP: 102/71 Pulse Rate: 77 COWS:    PATIENT STRENGTHS: (choose at least two) Ability for insight Average or above average intelligence Capable of independent living  Allergies:  Allergies  Allergen Reactions  . Oxycodone-Acetaminophen Hives  . Codeine Hives, Rash and Other (See Comments)    Scratchy throat     Home Medications:  (Not in a hospital admission)  OB/GYN Status:  No LMP recorded.  General Assessment Data Location of Assessment: [facility] ED TTS Assessment: In system Is this  a Tele or Face-to-Face Assessment?: Tele Assessment Is this an Initial Assessment or a Re-assessment for this encounter?: Initial Assessment Marital status: Single Maiden name: n/a Is patient pregnant?: No Pregnancy Status: No Living Arrangements: Parent Can pt return to current living arrangement?: Yes Admission Status: Involuntary Is patient capable of signing voluntary admission?: Yes Referral Source: Other Insurance type:  [employer]     Crisis Care Plan Living Arrangements: Parent Name of Psychiatrist: none Name of Therapist: none  Education Status Is patient currently in school?: No Current Grade: n/a Highest grade of school patient has completed: some college Name of school: n/a Contact person: Mother Lisa Fitzpatrick  Risk to self with the past 6 months Suicidal Ideation: No Has patient been a risk to self within the past 6 months prior to admission? : No Suicidal Intent: No Has patient had any suicidal intent within the past 6 months prior to admission? : No Is patient at risk for suicide?: Yes Suicidal Plan?: No Has patient had any suicidal plan within the past 6 months prior to admission? : No Access to Means: No What has been your use of drugs/alcohol within the last 12 months?: cannabis Previous Attempts/Gestures: Yes How many times?: 1 Other Self Harm Risks: none noted Triggers for Past Attempts: None known Intentional Self Injurious Behavior: None Recent stressful life event(s): Turmoil (Comment) Persecutory voices/beliefs?: No Depression: Yes Depression Symptoms: Despondent, Insomnia, Tearfulness, Isolating, Fatigue, Guilt, Loss of interest in usual pleasures, Feeling worthless/self pity Substance abuse history and/or treatment for substance abuse?: Yes Suicide prevention information given to non-admitted patients: Not applicable  Risk to Others within the past 6 months Homicidal Ideation: No Does patient have any lifetime risk of violence toward others beyond the six months prior to admission? : No Thoughts of Harm to Others: No Current Homicidal Intent: No Current Homicidal Plan: No Access to Homicidal Means: No Identified Victim: none History of harm to others?: No Assessment of Violence: None Noted Violent Behavior Description: none Does patient have access to weapons?: No Criminal Charges Pending?: Yes Describe Pending Criminal Charges: embezzlement Does patient have a  court date: Yes Court Date: 01/09/17 Is patient on probation?: No  Psychosis Hallucinations: None noted Delusions: None noted  Mental Status Report Appearance/Hygiene: In scrubs Eye Contact: Fair Motor Activity: Unremarkable Speech: Logical/coherent Level of Consciousness: Alert Mood: Depressed Affect: Depressed Anxiety Level: Moderate Thought Processes: Relevant Judgement: Partial Orientation: Person, Place, Time, Situation, Appropriate for developmental age Obsessive Compulsive Thoughts/Behaviors: Minimal  Cognitive Functioning Concentration: Normal Memory: Recent Intact, Remote Intact IQ: Average Insight: Fair Impulse Control: Poor Appetite: Fair Weight Loss: 0 Weight Gain: 0 Sleep: Decreased Total Hours of Sleep: 4 Vegetative Symptoms: None  ADLScreening [facility] Assessment Services) Patient's cognitive ability adequate to safely complete daily activities?: Yes Patient able to express need for assistance with ADLs?: Yes Independently performs ADLs?: Yes (appropriate for developmental age)  Prior Inpatient Therapy Prior Inpatient Therapy: Yes Prior Therapy Dates: 2017 August Prior Therapy Facilty/Provider(s): [facility] Reason for Treatment: SI/bipolar  Prior Outpatient Therapy Prior Outpatient Therapy: No Prior Therapy Dates: n/a Prior Therapy Facilty/Provider(s): n/a Reason for Treatment: n/a Does patient have an ACCT team?: No Does patient have Intensive In-House Services?  : No Does patient have [facility] services? : No Does patient have P4CC services?: No  ADL Screening (condition at time of admission) Patient's cognitive ability adequate to safely complete daily activities?: Yes Is the patient deaf or have difficulty hearing?: No Does the patient have difficulty seeing, even when wearing glasses/contacts?: No Does  the patient have difficulty concentrating, remembering, or making decisions?: No Patient able to express need for assistance with ADLs?: Yes Does  the patient have difficulty dressing or bathing?: No Independently performs ADLs?: Yes (appropriate for developmental age) Does the patient have difficulty walking or climbing stairs?: No Weakness of Legs: None Weakness of Arms/Hands: None       Abuse/Neglect Assessment (Assessment to be complete while patient is alone) Physical Abuse: Denies Verbal Abuse: Yes, past (Comment) Sexual Abuse: Denies Exploitation of patient/patient's resources: Denies Self-Neglect: Denies Values / Beliefs Cultural Requests During Hospitalization: None Spiritual Requests During Hospitalization: None   Advance Directives (For Healthcare) Does Patient Have a Medical Advance Directive?: No    Additional Information 1:1 In Past 12 Months?: No CIRT Risk: No Elopement Risk: No Does patient have medical clearance?: Yes     Disposition:  Per Karleen HampshireSpencer, PA recommend psych eval in a.m. Disposition Initial Assessment Completed for this Encounter: Yes Disposition of Patient: Other dispositions (a.m. psych eval)  Hipolito BayleyShean K Levora Werden 01/06/2017 2:51 AM

## 2017-01-07 DIAGNOSIS — F3130 Bipolar disorder, current episode depressed, mild or moderate severity, unspecified: Secondary | ICD-10-CM

## 2017-01-07 DIAGNOSIS — Z9889 Other specified postprocedural states: Secondary | ICD-10-CM

## 2017-01-07 DIAGNOSIS — Z79899 Other long term (current) drug therapy: Secondary | ICD-10-CM

## 2017-01-07 DIAGNOSIS — Z888 Allergy status to other drugs, medicaments and biological substances status: Secondary | ICD-10-CM

## 2017-01-07 DIAGNOSIS — Z9049 Acquired absence of other specified parts of digestive tract: Secondary | ICD-10-CM

## 2017-01-07 MED ORDER — TRAZODONE HCL 50 MG PO TABS
50.0000 mg | ORAL_TABLET | Freq: Every evening | ORAL | Status: DC | PRN
  Administered 2017-01-07 – 2017-01-08 (×2): 50 mg via ORAL
  Filled 2017-01-07: qty 1
  Filled 2017-01-07: qty 14
  Filled 2017-01-07: qty 1

## 2017-01-07 MED ORDER — DICYCLOMINE HCL 10 MG PO CAPS: 10.0000 mg | ORAL_CAPSULE | Freq: Three times a day (TID) | ORAL | Status: DC | PRN

## 2017-01-07 NOTE — Progress Notes (Signed)
Adult Psychoeducational Group Note  Date:  01/07/2017 Time:  11:48 AM  Group Topic/Focus:  Personal Choices and Values:   The focus of this group is to help patients assess and explore the importance of values in their lives, how their values affect their decisions, how they express their values and what opposes their expression.   Participation Level:  Minimal  Participation Quality:  Sharing  Affect:  Appropriate  Cognitive:  Alert  Insight: Limited  Engagement in Group:  Lacking  Modes of Intervention:  Reality Testing  Additional Comments:  Pt. Had little insight into the reality that she needs help with stealing and drugs. Did not see the benefit of the program.  Donell BeersRodney S Ilamae Geng 01/07/2017, 11:48 AM

## 2017-01-07 NOTE — Progress Notes (Signed)
Recreation Therapy Notes  Date: 01/07/17 Time: 0930 Location: [address] Hall Group Room  Group Topic: Stress Management  Goal Area(s) Addresses:  Patient will verbalize importance of using healthy stress management.  Patient will identify positive emotions associated with healthy stress management.   Intervention: Stress Management  Activity :  Letting Go Meditation.  LRT introduced the stress management technique of meditation.  LRT played a meditation from the Calm app so patients could engage in the activity.  Patients were to follow along with the meditation to participate.  Education:  Stress Management, Discharge Planning.   Education Outcome: Acknowledges edcuation/In group clarification offered/Needs additional education  Clinical Observations/Feedback: Pt did not attend group.   Yacine Droz, LRT/CTRS         Milissa Fesperman A 01/07/2017 12:41 PM 

## 2017-01-07 NOTE — Progress Notes (Signed)
Patient ID: Lisa Fitzpatrick, female   DOB: 07/17/1996, 21 y.o.   MRN: [id number]  DAR: Pt. Denies SI/HI and A/V Hallucinations. Patient does not report pain this morning but writer was notified later that patient does have pain at times after eating in her abdominal area as she has had her gallbladder removed. Writer made patient aware that this occurred that bentyl would be available. Patient verbalized understanding. Support and encouragement provided to the patient. Scheduled medications administered to patient per physician's orders. Patient is minimal and guarded. She is seen intermittently throughout the day. Her affect is flat and mood is depressed. Writer requested that patient fill out and turn in her daily inventory sheet however patient has not done so. Q15 minute checks are maintained for safety.
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
## 2017-01-07 NOTE — Progress Notes (Signed)
Pt has been observed in the dayroom watching TV with minimal interaction with staff or peers.  Conversation with pt was minimal.  She did not want her scheduled Trazodone for sleep.  She denies SI/HI/AVH.  She does not feel she needs to be here, but will do whatever she should to get out of here.  Pt did attend evening AA group.  Support and encouragement offered.  Pt voiced no needs or concerns.  Discharge plans are in process.  Safety maintained with q15 minute checks.
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
## 2017-01-07 NOTE — Tx Team (Signed)
Interdisciplinary Treatment and Diagnostic Plan Update  01/07/2017 Time of Session: 9:30AM Lisa Fitzpatrick MRN: [id number]  Principal Diagnosis: Bipolar Disorder Secondary Diagnoses: Active Problems:   Affective psychosis, bipolar (HCC)   Current Medications:  Current Facility-Administered Medications  Medication Dose Route Frequency Provider Last Rate Last Dose  . alum & mag hydroxide-simeth (MAALOX/MYLANTA) 200-200-20 MG/5ML suspension 30 mL  30 mL Oral Q4H PRN Sanjuana Kava, NP      . ARIPiprazole (ABILIFY) tablet 2 mg  2 mg Oral Daily Sanjuana Kava, NP   2 mg at 01/07/17 0856  . clotrimazole (LOTRIMIN) 1 % cream 1 application  1 application Topical BID Sanjuana Kava, NP   1 application at 01/06/17 1612  . hydrOXYzine (ATARAX/VISTARIL) tablet 25 mg  25 mg Oral Q6H PRN Sanjuana Kava, NP      . ibuprofen (ADVIL,MOTRIN) tablet 600 mg  600 mg Oral Q6H PRN Sanjuana Kava, NP      . magnesium hydroxide (MILK OF MAGNESIA) suspension 30 mL  30 mL Oral Daily PRN Sanjuana Kava, NP      . traZODone (DESYREL) tablet 50 mg  50 mg Oral QHS PRN Craige Cotta, MD      . venlafaxine XR (EFFEXOR-XR) 24 hr capsule 75 mg  75 mg Oral Q breakfast Sanjuana Kava, NP   75 mg at 01/07/17 0272   PTA Medications: Prescriptions Prior to Admission  Medication Sig Dispense Refill Last Dose  . ARIPiprazole (ABILIFY) 2 MG tablet Take 1 tablet (2 mg total) by mouth daily. 30 tablet 0 01/05/2017 at Unknown time  . clotrimazole (LOTRIMIN) 1 % cream Apply 1 application topically 2 (two) times daily. To rash on shoulders.   01/04/2017 at Unknown time  . dicyclomine (BENTYL) 20 MG tablet Take 1 tablet (20 mg total) by mouth 2 (two) times daily as needed (abdominal pain). 20 tablet 0 unknown  . propranolol (INDERAL) 10 MG tablet Take 10 mg by mouth daily as needed (anxiety).   01/05/2017 at Unknown time  . venlafaxine XR (EFFEXOR-XR) 75 MG 24 hr capsule Take 1 capsule (75 mg total) by mouth daily with breakfast. 30 capsule 0  01/05/2017 at Unknown time    Patient Stressors: Legal issue Marital or family conflict Occupational concerns  Patient Strengths: Ability for insight Average or above average intelligence Communication skills General fund of knowledge Physical Health Supportive family/friends  Treatment Modalities: Medication Management, Group therapy, Case management,  1 to 1 session with clinician, Psychoeducation, Recreational therapy.   Physician Treatment Plan for Primary Diagnosis:Bipolar Disorder Long Term Goal(s): Improvement in symptoms so as ready for discharge Improvement in symptoms so as ready for discharge   Short Term Goals: Ability to verbalize feelings will improve Ability to disclose and discuss suicidal ideas Ability to demonstrate self-control will improve Ability to identify and develop effective coping behaviors will improve Ability to maintain clinical measurements within normal limits will improve Ability to identify triggers associated with substance abuse/mental health issues will improve  Medication Management: Evaluate patient's response, side effects, and tolerance of medication regimen.  Therapeutic Interventions: 1 to 1 sessions, Unit Group sessions and Medication administration.  Evaluation of Outcomes: Progressing  Physician Treatment Plan for Secondary Diagnosis: Active Problems:   Affective psychosis, bipolar (HCC)  Long Term Goal(s): Improvement in symptoms so as ready for discharge Improvement in symptoms so as ready for discharge   Short Term Goals: Ability to verbalize feelings will improve Ability to disclose and discuss suicidal ideas  Ability to demonstrate self-control will improve Ability to identify and develop effective coping behaviors will improve Ability to maintain clinical measurements within normal limits will improve Ability to identify triggers associated with substance abuse/mental health issues will improve     Medication Management:  Evaluate patient's response, side effects, and tolerance of medication regimen.  Therapeutic Interventions: 1 to 1 sessions, Unit Group sessions and Medication administration.  Evaluation of Outcomes: Progressing   RN Treatment Plan for Primary Diagnosis: Bipolar Disorder Long Term Goal(s): Knowledge of disease and therapeutic regimen to maintain health will improve  Short Term Goals: Ability to remain free from injury will improve, Ability to verbalize feelings will improve and Ability to disclose and discuss suicidal ideas  Medication Management: RN will administer medications as ordered by provider, will assess and evaluate patient's response and provide education to patient for prescribed medication. RN will report any adverse and/or side effects to prescribing provider.  Therapeutic Interventions: 1 on 1 counseling sessions, Psychoeducation, Medication administration, Evaluate responses to treatment, Monitor vital signs and CBGs as ordered, Perform/monitor CIWA, COWS, AIMS and Fall Risk screenings as ordered, Perform wound care treatments as ordered.  Evaluation of Outcomes: Progressing   LCSW Treatment Plan for Primary Diagnosis: Bipolar Disorder Long Term Goal(s): Safe transition to appropriate next level of care at discharge, Engage patient in therapeutic group addressing interpersonal concerns.  Short Term Goals: Engage patient in aftercare planning with referrals and resources, Facilitate patient progression through stages of change regarding substance use diagnoses and concerns and Identify triggers associated with mental health/substance abuse issues  Therapeutic Interventions: Assess for all discharge needs, 1 to 1 time with Social worker, Explore available resources and support systems, Assess for adequacy in community support network, Educate family and significant other(s) on suicide prevention, Complete Psychosocial Assessment, Interpersonal group therapy.  Evaluation of  Outcomes: Progressing   Progress in Treatment: Attending groups: Yes. Participating in groups: Yes. Taking medication as prescribed: Yes. Toleration medication: Yes. Family/Significant other contact made: No, will contact:  family member if patient consents Patient understands diagnosis: Yes. Discussing patient identified problems/goals with staff: Yes. Medical problems stabilized or resolved: Yes. Denies suicidal/homicidal ideation: Yes. Issues/concerns per patient self-inventory: No. Other: n/a  New problem(s) identified: No, Describe:  n/a  New Short Term/Long Term Goal(s): medication/mood stabilization; development of comprehensive mental wellness/sobriety plan.   Discharge Plan or Barriers: CSW assessing for appropriate referrals. Pt states that she is planning [facility]   Reason for Continuation of Hospitalization: Depression Medication stabilization Withdrawal symptoms  Estimated Length of Stay: 3-5 days   Attendees: Patient: 01/07/2017 3:17 PM  Physician: Dr. Jama Flavorsobos  MD; Dr. Elna BreslowEappen MD 01/07/2017 3:17 PM  Nursing: Wilhelmenia Blasearoline; Christa RN 01/07/2017 3:17 PM  RN Care Manager: Onnie BoerJennifer Clark CM 01/07/2017 3:17 PM  Social Worker: Herbert SetaHeather Smart, LCSW; Vernie ShanksLauren Newton LCSW 01/07/2017 3:17 PM  Recreational Therapist:  01/07/2017 3:17 PM  Other: Gray BernhardtMay Augustin NP; Armandina Stammergnes Nwoko NP 01/07/2017 3:17 PM  Other:  01/07/2017 3:17 PM  Other: 01/07/2017 3:17 PM    Scribe for Treatment Team: Ledell PeoplesHeather N Smart, LCSW 01/07/2017 3:17 PM
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
## 2017-01-07 NOTE — BHH Group Notes (Signed)
BHH LCSW Group Therapy  01/07/2017 3:17 PM  Type of Therapy:  Group Therapy  Participation Level:  Minimal  Participation Quality:  Inattentive  Affect:  Flat  Cognitive:  Oriented  Insight:  None  Engagement in Therapy:  Limited  Modes of Intervention:  Discussion, Education, Exploration, Problem-solving, Socialization and Support  Summary of Progress/Problems: Today's Topic: Overcoming Obstacles. Patients identified one short term goal and potential obstacles in reaching this goal. Patients processed barriers involved in overcoming these obstacles. Patients identified steps necessary for overcoming these obstacles and explored motivation (internal and external) for facing these difficulties head on.   Lisa Fitzpatrick N Smart LCSW 01/07/2017, 3:17 PM

## 2017-01-08 ENCOUNTER — Encounter (HOSPITAL_COMMUNITY): Payer: Self-pay | Admitting: Psychiatry

## 2017-01-08 DIAGNOSIS — F1220 Cannabis dependence, uncomplicated: Secondary | ICD-10-CM | POA: Clinically undetermined

## 2017-01-08 DIAGNOSIS — Z8659 Personal history of other mental and behavioral disorders: Secondary | ICD-10-CM

## 2017-01-08 MED ORDER — TRAZODONE HCL 50 MG PO TABS: 50.0000 mg | ORAL_TABLET | Freq: Every evening | ORAL | 0 refills | Status: DC | PRN

## 2017-01-08 MED ORDER — ARIPIPRAZOLE 2 MG PO TABS: 2.0000 mg | ORAL_TABLET | Freq: Every day | ORAL | 0 refills | Status: DC

## 2017-01-08 MED ORDER — HYDROXYZINE HCL 25 MG PO TABS: 25.0000 mg | ORAL_TABLET | Freq: Four times a day (QID) | ORAL | 0 refills | Status: DC | PRN

## 2017-01-08 MED ORDER — VENLAFAXINE HCL ER 75 MG PO CP24: 75.0000 mg | ORAL_CAPSULE | Freq: Every day | ORAL | 0 refills | Status: DC

## 2017-01-08 NOTE — Progress Notes (Signed)
[facility] Group Notes:  (Nursing/MHT/Case Management/Adjunct)  Date:  01/08/2017  Time:  2100 Type of Therapy:  wrap up group  Participation Level:  Minimal  Participation Quality:  Attentive, Resistant and Supportive  Affect:  Flat  Cognitive:  Appropriate  Insight:  Lacking  Engagement in Group:  Lacking  Modes of Intervention:  Clarification, Education and Support  Summary of Progress/Problems: Pt shared that she was disappointed that she was unable to leave today and when asked where she had been before being admitted she answered "[place]". When asked to clarify what happened pt reported "I'm involuntary". Pt did share that she [facility].   Marcille BuffyMcNeil, Ondre Salvetti S 01/08/2017, 11:45 PM

## 2017-01-08 NOTE — Progress Notes (Signed)
Patient was to be discharged at 4pm today.  She had arranged to go to [facility].  She states that she called [facility] and that her room there would not be ready until tomorrow.  NP notified.  Discharge cancelled for today.

## 2017-01-08 NOTE — Progress Notes (Signed)
  [facility] :  Will you be returning to the same living situation after discharge:  No. Pt has been accepted to [facility] in [place], [place].  At discharge, do you have transportation home?: Yes,  boyfriend coming at 4pm Do you have the ability to pay for your medications: Yes,  [employer]  Release of information consent forms completed and submitted to medical records by CSW.  Patient to Follow up at: Follow-up Information    [facility] Follow up.   Specialty:  Professional Counselor Why:  Walk in within 7 days of discharge from the hospital to be assessed for mental health services including (medication management, counseling, and support groups). Walk in hours: 8am-12pm Monday through Friday. Thank you.  Contact information: [address] [place] [id number] [phone]           Next level of care provider has [facility] to [facility] Link:no  Safety Planning and Suicide Prevention discussed: Yes,  SPE completed with pt; pt declined to consent to family contact. SPI pamphlet and Mobile Crisis information provided to pt.  Have you used any form of tobacco in the last 30 days? (Cigarettes, Smokeless Tobacco, Cigars, and/or Pipes): No  Has patient been referred to the Quitline?: N/A patient is not a smoker  Patient has been referred for addiction treatment: Yes  Torryn Fiske N Smart LCSW 01/08/2017, 1:16 PM

## 2017-01-08 NOTE — B[facility] Suicide Risk Assessment (Addendum)
[facility]   Principal Problem: Cannabis use disorder, severe, dependence (HCC) Discharge Diagnoses:  Patient Active Problem List   Diagnosis Date Noted  . Cannabis use disorder, severe, dependence (HCC) [F12.20] 01/08/2017  . Hx of bipolar disorder [Z86.59] 01/08/2017    Total Time spent with patient: 30 minutes  Musculoskeletal: Strength & Muscle Tone: within normal limits Gait & Station: normal Patient leans: N/A  Psychiatric Specialty Exam: Review of Systems  Psychiatric/Behavioral: Positive for substance abuse. Negative for depression, hallucinations and suicidal ideas. The patient is not nervous/anxious.   All other systems reviewed and are negative.   Blood pressure 109/68, pulse 85, temperature 98.6 F (37 C), temperature source Oral, resp. rate 16, height 5\' 4"  (1.626 m), weight 74 kg (163 lb 2.3 oz), SpO2 98 %.Body mass index is 28 kg/m.  General Appearance: Casual  Eye Contact::  Fair  Speech:  Clear and Coherent409  Volume:  Normal  Mood:  Euthymic  Affect:  Appropriate  Thought Process:  Goal Directed and Descriptions of Associations: Intact  Orientation:  Full (Time, Place, and Person)  Thought Content:  Logical  Suicidal Thoughts:  No  Homicidal Thoughts:  No  Memory:  Immediate;   Fair Recent;   Fair Remote;   Fair  Judgement:  Fair  Insight:  Fair  Psychomotor Activity:  Normal  Concentration:  Fair  Recall:  [employer]  Fund of Knowledge:Fair  Language: Fair  Akathisia:  No  Handed:  Right  AIMS (if indicated):     Assets:  Communication Skills Desire for Improvement  Sleep:  Number of Hours: 6  Cognition: WNL  ADL's:  Intact   Mental Status Per Nursing Assessment::   On Admission:  Suicidal ideation indicated by patient  Demographic Factors:  Caucasian  Loss Factors: Legal issues and Financial problems/change in socioeconomic status  Historical Factors: Impulsivity  Risk Reduction Factors:   Positive  therapeutic relationship  Continued Clinical Symptoms:  Alcohol/Substance Abuse/Dependencies  Cognitive Features That Contribute To Risk:  None    Suicide Risk:  Minimal: No identifiable suicidal ideation.  Patients presenting with no risk factors but with morbid ruminations; may be classified as minimal risk based on the severity of the depressive symptoms  Follow-up Information    [facility] Follow up.   Specialty:  Professional Counselor Why:  Walk in within 7 days of discharge from the hospital to be assessed for mental health services including (medication management, counseling, and support groups). Walk in hours: 8am-12pm Monday through Friday. Thank you.  Contact information: [address] [place] [id number] [phone]           Plan Of Care/Follow-up recommendations:  Activity:  no restrictions Diet:  regular Other:  none  Chaniya Genter, MD 01/09/2017, 10:02 AM

## 2017-01-08 NOTE — Progress Notes (Signed)
D:  Patient awake and alert; oriented x 4; she denies suicidal and homicidal ideation and AVH; no self-injurious behaviors noted or reported. Affect flat; Mood depressed/irritable A:  Medications given as scheduled;  Emotional support provided; encouraged her to seek assistance with needs/concerns. R:  Safety maintained on unit. Remains on q 15 minute safety checks.

## 2017-01-08 NOTE — BHH Suicide Risk Assessment (Signed)
BHH INPATIENT:  Family/Significant Other Suicide Prevention Education  Suicide Prevention Education:  Patient Refusal for Family/Significant Other Suicide Prevention Education: The patient Lisa Fitzpatrick has refused to provide written consent for family/significant other to be provided Family/Significant Other Suicide Prevention Education during admission and/or prior to discharge.  Physician notified.  SPE completed with pt, as pt refused to consent to family contact. SPI pamphlet provided to pt and pt was encouraged to share information with support network, ask questions, and talk about any concerns relating to SPE. Pt denies access to guns/firearms and verbalized understanding of information provided. Mobile Crisis information also provided to pt.   Jibril Mcminn N Smart LCSW 01/08/2017, 10:22 AM

## 2017-01-08 NOTE — Discharge Summary (Signed)
Physician Discharge Summary Note  Patient:  Lisa Fitzpatrick is an 21 y.o., female MRN:  [id number] DOB:  Jun 21, 1996 Patient phone:  [phone] (home)  Patient address:   [address] [place] [address],  Total Time spent with patient: 30 minutes  Date of Admission:  01/06/2017 Date of Discharge: 01/09/2017  Reason for Admission:  21 year old female, currently living with mother. She is known to Clinical research associate, unit from a prior admission in August/2017.  Patient states she has been stable and has been " doing OK up to Monday". States that on that day her mother searched her car and found cannabis and some paraphernalia. States " she was really angry with me, kicked me out of the house ." States mother " also went to my job, told my boss I was using drugs , stealing syringes ,so I was fired and charged with embezzlement ".  Patient states that she did make suicidal threats during argument with her mother regarding above, but states she did not actually have any suicidal plan or intention. Patient states that she feels her mother has been unsupportive and " really harmed me by doing all this". She minimizes drug abuse, states she only smokes cannabis occasionally, and denies any other drug abuse . Patient states that prior to the above stressors she had been doing well, not depressed.  Associated Signs/Symptoms: Depression Symptoms:  Denies neuro-vegetative symptoms of depression, reports normal sleep, energy, appetite, denies anhedonia, denies any suicidal ideations, reports her sense of self esteem as " pretty good " (Hypo) Manic Symptoms:  Does not endorse manic symptoms Anxiety Symptoms:  Denies any agoraphobia, denies panic, minimizes anxiety Psychotic Symptoms:  Denies  PTSD Symptoms: Denies   Principal Problem: Affective psychosis, bipolar [facility]) Discharge Diagnoses: Patient Active Problem List   Diagnosis Date Noted  . 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 Severe episode of recurrent major depressive disorder, without psychotic features (HCC) [F33.2]   . Bipolar affective disorder, current episode depressed (HCC) [F31.30] 08/19/2016    Past Psychiatric History: see HPI   Past Medical History:  Past Medical History:  Diagnosis Date  . Anxiety   . Bipolar disorder (HCC)   . Depression   . PONV (postoperative nausea and vomiting)     Past Surgical History:  Procedure Laterality Date  . CHOLECYSTECTOMY N/A 09/25/2016   Procedure: LAPAROSCOPIC CHOLECYSTECTOMY;  Surgeon: Abigail Miyamoto, MD;  Location: [facility] OR;  Service: General;  Laterality: N/A;  . WISDOM TOOTH EXTRACTION     Family History: History reviewed. No pertinent family history. Family Psychiatric  History: see HPI Social History:  History  Alcohol Use  . Yes    Comment: occ     History  Drug Use  . Frequency: 7.0 times per week  . Types: Marijuana    Comment: daily user     Social History   Social History  . Marital status: Single    Spouse name: N/A  . Number of children: N/A  . Years of education: N/A   Social History Main Topics  . Smoking status: Former Smoker    Years: 4.00    Quit date: 09/25/2014  . Smokeless tobacco: Never Used     Comment: Hookah   . Alcohol use Yes     Comment: occ  . Drug use:     Frequency: 7.0 times per week    Types: Marijuana     Comment: daily user   .  Sexual activity: Yes    Birth control/ protection: Condom   Other Topics Concern  . None   Social History Narrative  . None    Hospital Course:   Lisa Fitzpatrick was admitted for Affective psychosis, bipolar (HCC) and crisis management.  Patient was treated with medications with their indications listed below in detail under Medication List. She was started on ABilify 2mg  po daily for mood stabilization, and Effexor XR 75mg  po daily for depression of which she tolerated well.  Medical problems were identified and treated as needed.  Home medications were  restarted as appropriate.  Improvement was monitored by observation and Lisa Fitzpatrick daily report of symptom reduction.  Emotional and mental status was monitored by daily self inventory reports completed by Lisa Fitzpatrick and clinical staff.  Patient reported continued improvement, denied any new concerns.  Patient had been compliant on medications and denied side effects.  Support and encouragement was provided.         Lisa Fitzpatrick was evaluated by the treatment team for stability and plans for continued recovery upon discharge.  Patient was offered further treatment options upon discharge including Residential, Intensive Outpatient and Outpatient treatment. Patient will follow up with agency listed below for medication management and counseling.  Encouraged patient to maintain satisfactory support network and home environment.  Advised to adhere to medication compliance and outpatient treatment follow up.  Prescriptions provided.       Lisa Fitzpatrick motivation was an integral factor for scheduling further treatment.  Employment, transportation, bed availability, health status, family support, and any pending legal issues were also considered during patient's hospital stay.  Upon completion of this admission the patient was both mentally and medically stable for discharge denying suicidal/homicidal ideation, auditory/visual/tactile hallucinations, delusional thoughts and paranoia.      Physical Findings: AIMS: Facial and Oral Movements Muscles of Facial Expression: None, normal Lips and Perioral Area: None, normal Jaw: None, normal Tongue: None, normal,Extremity Movements Upper (arms, wrists, hands, fingers): None, normal Lower (legs, knees, ankles, toes): None, normal, Trunk Movements Neck, shoulders, hips: None, normal, Overall Severity Severity of abnormal movements (highest score from questions above): None, normal Incapacitation due to abnormal movements: None, normal Patient's awareness  of abnormal movements (rate only patient's report): No Awareness, Dental Status Current problems with teeth and/or dentures?: No Does patient usually wear dentures?: No  CIWA:    COWS:     Musculoskeletal: Strength & Muscle Tone: within normal limits Gait & Station: normal Patient leans: N/A  Psychiatric Specialty Exam: Physical Exam  Nursing note and vitals reviewed. Psychiatric: She has a normal mood and affect. Her speech is normal and behavior is normal. Judgment and thought content normal. Cognition and memory are normal.    Review of Systems  Constitutional: Negative.   HENT: Negative.   Eyes: Negative.   Respiratory: Negative.   Cardiovascular: Negative.   Gastrointestinal: Negative.   Genitourinary: Negative.   Musculoskeletal: Negative.   Skin: Negative.   Neurological: Negative.   Endo/Heme/Allergies: Negative.   Psychiatric/Behavioral: Negative.   All other systems reviewed and are negative.   Blood pressure 112/67, pulse 88, temperature 98.1 F (36.7 C), temperature source Oral, resp. rate 18, height 5\' 4"  (1.626 m), weight 74 kg (163 lb 2.3 oz), SpO2 98 %.Body mass index is 28 kg/m.    Have you used any form of tobacco in the last 30 days? (Cigarettes, Smokeless Tobacco, Cigars, and/or Pipes): No  Has this patient used any form  of tobacco in the last 30 days? (Cigarettes, Smokeless Tobacco, Cigars, and/or Pipes) Yes, N/A  Blood Alcohol level:  Lab Results  Component Value Date   ETH <5 01/05/2017   ETH <5 08/18/2016     Metabolic Disorder Labs:  Lab Results  Component Value Date   HGBA1C (H) 05/21/2010    5.9 (NOTE)                                                                       According to the ADA Clinical Practice Recommendations for 2011, when HbA1c is used as a screening test:   >=6.5%   Diagnostic of Diabetes Mellitus           (if abnormal result  is confirmed)  5.7-6.4%   Increased risk of developing Diabetes Mellitus   References:Diagnosis and Classification of Diabetes Mellitus,Diabetes Care,2011,34(Suppl 1):S62-S69 and Standards of Medical Care in         Diabetes - 2011,Diabetes Care,2011,34  (Suppl 1):S11-S61.   MPG (H) 05/21/2010    123 (NOTE)  Please note change in reference range(s).     MPG 114 10/18/2009    No results found for: PROLACTIN Lab Results  Component Value Date   CHOL  05/21/2010    127        ATP III CLASSIFICATION:  <200     mg/dL   Desirable  161-096  mg/dL   Borderline High  >=045    mg/dL   High          TRIG 28 05/21/2010   HDL 55 05/21/2010   CHOLHDL 2.3 05/21/2010   VLDL 6 05/21/2010   LDLCALC  05/21/2010    66        Total Cholesterol/HDL:CHD Risk Coronary Heart Disease Risk Table                     Men   Women  1/2 Average Risk   3.4   3.3  Average Risk       5.0   4.4  2 X Average Risk   9.6   7.1  3 X Average Risk  23.4   11.0        Use the calculated Patient Ratio above and the CHD Risk Table to determine the patient's CHD Risk.        ATP III CLASSIFICATION (LDL):  <100     mg/dL   Optimal  409-811  mg/dL   Near or Above                    Optimal  130-159  mg/dL   Borderline  914-782  mg/dL   High  >956     mg/dL   Very High   LDLCALC  10/18/2009    72        Total Cholesterol/HDL:CHD Risk Coronary Heart Disease Risk Table                     Men   Women  1/2 Average Risk   3.4   3.3  Average Risk       5.0   4.4  2 X Average Risk   9.6   7.1  3 X  Average Risk  23.4   11.0        Use the calculated Patient Ratio above and the CHD Risk Table to determine the patient's CHD Risk.        ATP III CLASSIFICATION (LDL):  <100     mg/dL   Optimal  161-096  mg/dL   Near or Above                    Optimal  130-159  mg/dL   Borderline  045-409  mg/dL   High  >811     mg/dL   Very High    See Psychiatric Specialty Exam and Suicide Risk Assessment completed by Attending Physician prior to discharge.  Discharge destination:  Home  Is  patient on multiple antipsychotic therapies at discharge:  No   Has Patient had three or more failed trials of antipsychotic monotherapy by history:  No  Recommended Plan for Multiple Antipsychotic Therapies: NA   Allergies as of 01/08/2017      Reactions   Oxycodone-acetaminophen Hives   Codeine Hives, Rash, Other (See Comments)   Scratchy throat       Medication List    STOP taking these medications   clotrimazole 1 % cream Commonly known as:  LOTRIMIN   dicyclomine 20 MG tablet Commonly known as:  BENTYL   propranolol 10 MG tablet Commonly known as:  INDERAL     TAKE these medications     Indication  ARIPiprazole 2 MG tablet Commonly known as:  ABILIFY Take 1 tablet (2 mg total) by mouth daily. Start taking on:  01/09/2017  Indication:  Major Depressive Disorder   hydrOXYzine 25 MG tablet Commonly known as:  ATARAX/VISTARIL Take 1 tablet (25 mg total) by mouth every 6 (six) hours as needed for anxiety.  Indication:  Anxiety Neurosis   traZODone 50 MG tablet Commonly known as:  DESYREL Take 1 tablet (50 mg total) by mouth at bedtime as needed for sleep.  Indication:  Trouble Sleeping   venlafaxine XR 75 MG 24 hr capsule Commonly known as:  EFFEXOR-XR Take 1 capsule (75 mg total) by mouth daily with breakfast. Start taking on:  01/09/2017  Indication:  Major Depressive Disorder      Follow-up Information    [facility] Follow up.   Specialty:  Professional Counselor Why:  Walk in within 7 days of discharge from the hospital to be assessed for mental health services including (medication management, counseling, and support groups). Walk in hours: 8am-12pm Monday through Friday. Thank you.  Contact information: [address] [place] [address] [phone]           Follow-up recommendations:  Activity:  as tol Diet:  as tol  Comments:  1.  Take all your medications as prescribed.   2.  Report any adverse side  effects to outpatient provider. 3.  Patient instructed to not use alcohol or illegal drugs while on prescription medicines. 4.  In the event of worsening symptoms, instructed patient to call 911, the crisis hotline or go to nearest emergency room for evaluation of symptoms.  Signed: Truman Hayward, FNP [facility] 01/08/2017, 1:51 PM   Patient seen, Suicide Assessment Completed.  Disposition Plan Reviewed

## 2017-01-08 NOTE — Progress Notes (Signed)
Pt observed sitting in the dayroom watching TV at the beginning of the shift.  She is a little frustrated that she did not discharge today, but understands that her bed at [facility] will be ready tomorrow.  Pt denies SI/HI/AVH.  Pt still presents as sullen, but she is cooperative and appropriate on the unit.  She makes her needs known and voices no needs or concerns at this time.  Support and encouragement offered.  Discharge plans are in process.  Safety maintained with q15 minute checks.

## 2017-01-08 NOTE — Tx Team (Signed)
Interdisciplinary Treatment and Diagnostic Plan Update  01/08/2017 Time of Session: 9:30AM Lisa Fitzpatrick MRN: [id number]  Principal Diagnosis: Bipolar Disorder Secondary Diagnoses: Active Problems:   Affective psychosis, bipolar ([place])   Current Medications:  Current Facility-Administered Medications  Medication Dose Route Frequency Provider Last Rate Last Dose  . alum & mag hydroxide-simeth (MAALOX/MYLANTA) 200-200-20 MG/5ML suspension 30 mL  30 mL Oral Q4H PRN Encarnacion Slates, NP      . ARIPiprazole (ABILIFY) tablet 2 mg  2 mg Oral Daily Encarnacion Slates, NP   2 mg at 01/08/17 0820  . clotrimazole (LOTRIMIN) 1 % cream 1 application  1 application Topical BID Encarnacion Slates, NP   1 application at 20/10/07 [phone]  . dicyclomine (BENTYL) capsule 10 mg  10 mg Oral Q8H PRN Kerrie Buffalo, NP      . hydrOXYzine (ATARAX/VISTARIL) tablet 25 mg  25 mg Oral Q6H PRN Encarnacion Slates, NP   25 mg at 01/07/17 2141  . ibuprofen (ADVIL,MOTRIN) tablet 600 mg  600 mg Oral Q6H PRN Encarnacion Slates, NP      . magnesium hydroxide (MILK OF MAGNESIA) suspension 30 mL  30 mL Oral Daily PRN Encarnacion Slates, NP      . traZODone (DESYREL) tablet 50 mg  50 mg Oral QHS PRN Jenne Campus, MD   50 mg at 01/07/17 2141  . venlafaxine XR (EFFEXOR-XR) 24 hr capsule 75 mg  75 mg Oral Q breakfast Encarnacion Slates, NP   75 mg at 01/08/17 0820   PTA Medications: Prescriptions Prior to Admission  Medication Sig Dispense Refill Last Dose  . ARIPiprazole (ABILIFY) 2 MG tablet Take 1 tablet (2 mg total) by mouth daily. 30 tablet 0 01/05/2017 at Unknown time  . clotrimazole (LOTRIMIN) 1 % cream Apply 1 application topically 2 (two) times daily. To rash on shoulders.   01/04/2017 at Unknown time  . dicyclomine (BENTYL) 20 MG tablet Take 1 tablet (20 mg total) by mouth 2 (two) times daily as needed (abdominal pain). 20 tablet 0 unknown  . propranolol (INDERAL) 10 MG tablet Take 10 mg by mouth daily as needed (anxiety).   01/05/2017 at Unknown time  .  venlafaxine XR (EFFEXOR-XR) 75 MG 24 hr capsule Take 1 capsule (75 mg total) by mouth daily with breakfast. 30 capsule 0 01/05/2017 at Unknown time    Patient Stressors: Legal issue Marital or family conflict Occupational concerns  Patient Strengths: Ability for insight Average or above average intelligence Communication skills General fund of knowledge Physical Health Supportive family/friends  Treatment Modalities: Medication Management, Group therapy, Case management,  1 to 1 session with clinician, Psychoeducation, Recreational therapy.   Physician Treatment Plan for Primary Diagnosis:Bipolar Disorder Long Term Goal(s): Improvement in symptoms so as ready for discharge Improvement in symptoms so as ready for discharge   Short Term Goals: Ability to verbalize feelings will improve Ability to disclose and discuss suicidal ideas Ability to demonstrate self-control will improve Ability to identify and develop effective coping behaviors will improve Ability to maintain clinical measurements within normal limits will improve Ability to identify triggers associated with substance abuse/mental health issues will improve  Medication Management: Evaluate patient's response, side effects, and tolerance of medication regimen.  Therapeutic Interventions: 1 to 1 sessions, Unit Group sessions and Medication administration.  Evaluation of Outcomes: Met  Physician Treatment Plan for Secondary Diagnosis: Active Problems:   Affective psychosis, bipolar ([place])  Long Term Goal(s): Improvement in symptoms so as ready for discharge  Improvement in symptoms so as ready for discharge   Short Term Goals: Ability to verbalize feelings will improve Ability to disclose and discuss suicidal ideas Ability to demonstrate self-control will improve Ability to identify and develop effective coping behaviors will improve Ability to maintain clinical measurements within normal limits will improve Ability to  identify triggers associated with substance abuse/mental health issues will improve     Medication Management: Evaluate patient's response, side effects, and tolerance of medication regimen.  Therapeutic Interventions: 1 to 1 sessions, Unit Group sessions and Medication administration.  Evaluation of Outcomes: Met   RN Treatment Plan for Primary Diagnosis: Bipolar Disorder Long Term Goal(s): Knowledge of disease and therapeutic regimen to maintain health will improve  Short Term Goals: Ability to remain free from injury will improve, Ability to verbalize feelings will improve and Ability to disclose and discuss suicidal ideas  Medication Management: RN will administer medications as ordered by provider, will assess and evaluate patient's response and provide education to patient for prescribed medication. RN will report any adverse and/or side effects to prescribing provider.  Therapeutic Interventions: 1 on 1 counseling sessions, Psychoeducation, Medication administration, Evaluate responses to treatment, Monitor vital signs and CBGs as ordered, Perform/monitor CIWA, COWS, AIMS and Fall Risk screenings as ordered, Perform wound care treatments as ordered.  Evaluation of Outcomes: met   LCSW Treatment Plan for Primary Diagnosis: Bipolar Disorder Long Term Goal(s): Safe transition to appropriate next level of care at discharge, Engage patient in therapeutic group addressing interpersonal concerns.  Short Term Goals: Engage patient in aftercare planning with referrals and resources, Facilitate patient progression through stages of change regarding substance use diagnoses and concerns and Identify triggers associated with mental health/substance abuse issues  Therapeutic Interventions: Assess for all discharge needs, 1 to 1 time with Social worker, Explore available resources and support systems, Assess for adequacy in community support network, Educate family and significant other(s) on  suicide prevention, Complete Psychosocial Assessment, Interpersonal group therapy.  Evaluation of Outcomes: Met   Progress in Treatment: Attending groups: Yes. Participating in groups: Yes. Taking medication as prescribed: Yes. Toleration medication: Yes. Family/Significant other contact made: SPE completed with pt; pt declined to consent to family contact.  Patient understands diagnosis: Yes. Discussing patient identified problems/goals with staff: Yes. Medical problems stabilized or resolved: Yes. Denies suicidal/homicidal ideation: Yes. Issues/concerns per patient self-inventory: No. Other: n/a  New problem(s) identified: No, Describe:  n/a  New Short Term/Long Term Goal(s): medication/mood stabilization; development of comprehensive mental wellness/sobriety plan.   Discharge Plan or Barriers: Pt has been accepted to [facility] in [place] for today. Pt states that she is planning to follow-up with [facility].   Reason for Continuation of Hospitalization: none  Estimated Length of Stay: d/c today  Attendees: Patient: 01/08/2017 1:15 PM  Physician: Dr. Parke Poisson  MD; Dr. Shea Evans MD 01/08/2017 1:15 PM  Nursing: Shelby Mattocks RN 01/08/2017 1:15 PM  RN Care Manager: Lars Pinks CM 01/08/2017 1:15 PM  Social Worker: Maxie Better, LCSW; Vidal Schwalbe LCSW 01/08/2017 1:15 PM  Recreational Therapist:  01/08/2017 1:15 PM  Other: Samuel Jester NP; Lindell Spar NP 01/08/2017 1:15 PM  Other:  01/08/2017 1:15 PM  Other: 01/08/2017 1:15 PM    Scribe for Treatment Team: Kimber Relic Smart, LCSW 01/08/2017 1:15 PM

## 2017-01-08 NOTE — Progress Notes (Signed)
Pt has been up and out of her room this evening.  She spent some time in the dayroom after attending evening group, and was also observed talking on the phone a couple of times.  Pt reports she is doing better today.  She denies SI/HI/AVH.  She says she will not return to her mother's home, but will stay with a friend at discharge.  Pt has minimal interaction with staff and peers.  She will make her needs known to staff.  Support and encouragement offered.  Discharge plans are in process.  Safety maintained with q15 minute checks.

## 2017-01-09 DIAGNOSIS — Z87891 Personal history of nicotine dependence: Secondary | ICD-10-CM

## 2017-01-09 DIAGNOSIS — F1220 Cannabis dependence, uncomplicated: Secondary | ICD-10-CM

## 2017-01-09 LAB — LIPID PANEL
CHOLESTEROL: 158 mg/dL (ref 0–200)
HDL: 61 mg/dL (ref >40)
LDL Cholesterol: 81 mg/dL (ref 0–99)
TRIGLYCERIDES: 81 mg/dL (ref <150)
Total CHOL/HDL Ratio: 2.6 RATIO
VLDL: 16 mg/dL (ref 0–40)

## 2017-01-09 NOTE — Progress Notes (Signed)
Pt discharged home with the boyfriend. Pt was ambulatory, stable and appreciative at that time. All papers and prescriptions were given and valuables returned. Verbal understanding expressed. Denies SI/HI and A/VH. Pt given opportunity to express concerns and ask questions.  

## 2017-01-09 NOTE — Progress Notes (Signed)
  [facility] Adult Case Management Discharge Plan :  Will you be returning to the same living situation after discharge:  No. Pt has been accepted to [facility] [place], [place]  At discharge, do you have transportation home?: Yes-family member or friend coming to transport her today (pt was originally scheduled for discharge on 01/08/17 but her bed at [facility] would not be ready until today).  Do you have the ability to pay for your medications: Yes,  [employer]  Release of information consent forms completed and submitted to medical records by CSW.  Patient to Follow up at: Follow-up Information    [facility] Follow up.   Specialty:  Professional Counselor Why:  Walk in within 7 days of discharge from the hospital to be assessed for mental health services including (medication management, counseling, and support groups). Walk in hours: 8am-12pm Monday through Friday. Thank you.  Contact information: [address] [place] [id number] [phone]           Next level of care provider has access to [facility] Link:no  Safety Planning and Suicide Prevention discussed: Yes,  SPE completed with pt; pt declined to consent to family contact. SPI pamphlet and Mobile Crisis information provided to pt.  Have you used any form of tobacco in the last 30 days? (Cigarettes, Smokeless Tobacco, Cigars, and/or Pipes): No  Has patient been referred to the Quitline?: N/A patient is not a smoker  Patient has been referred for addiction treatment: Yes  Ledell PeoplesHeather N Smart LCSW 01/09/2017, 8:13 AM

## 2017-01-09 NOTE — Progress Notes (Signed)
Recreation Therapy Notes  Date: 01/09/17 Time: 0930 Location: [address] Dayroom  Group Topic: Stress Management  Goal Area(s) Addresses:  Patient will verbalize importance of using healthy stress management.  Patient will identify positive emotions associated with healthy stress management.   Behavorial Response:  Engaged  Intervention: Stress Management  Activity :  Choice Meditation.  LRT introduced the stress management technique of meditation.  LRT played a meditation focused on choices to allow patients the opportunity to engage in the activity.  Patients were to follow along with the meditation as it played.  Education:  Stress Management, Discharge Planning.   Education Outcome: Acknowledges edcuation/In group clarification offered/Needs additional education  Clinical Observations/Feedback: Pt attended group.    Caroll RancherMarjette Reegan Mctighe, LRT/CTRS         Caroll RancherLindsay, Demarko Zeimet A 01/09/2017 12:51 PM

## 2017-01-10 LAB — PROLACTIN: Prolactin: 24.7 ng/mL — ABNORMAL HIGH (ref 4.8–23.3)

## 2017-01-10 LAB — HEMOGLOBIN A1C
HEMOGLOBIN A1C: 5.2 % (ref 4.8–5.6)
Mean Plasma Glucose: 103 mg/dL

## 2017-01-31 ENCOUNTER — Encounter (HOSPITAL_COMMUNITY): Payer: Self-pay | Admitting: Emergency Medicine

## 2017-01-31 ENCOUNTER — Emergency Department (HOSPITAL_COMMUNITY)
Admission: EM | Admit: 2017-01-31 | Discharge: 2017-01-31 | Disposition: A | Discharge status: Home / Self Care | Payer: 59 | Attending: Dermatology | Admitting: Dermatology

## 2017-01-31 DIAGNOSIS — Z87891 Personal history of nicotine dependence: Secondary | ICD-10-CM | POA: Diagnosis not present

## 2017-01-31 DIAGNOSIS — Z5321 Procedure and treatment not carried out due to patient leaving prior to being seen by health care provider: Secondary | ICD-10-CM | POA: Insufficient documentation

## 2017-01-31 DIAGNOSIS — J111 Influenza due to unidentified influenza virus with other respiratory manifestations: Secondary | ICD-10-CM | POA: Insufficient documentation

## 2017-01-31 NOTE — ED Triage Notes (Signed)
Pt presents with mother stating she was diagnosed with the flu about 1 today. Pts mom states pts temp of 105.2 via ear. Pt has taken tamiflu and 1g tylenol. Pt adds 3 episodes of emesis.

## 2017-01-31 NOTE — ED Notes (Signed)
PA was walking into room to evaluate patient and patient stated she did not want to be seen anymore. Mother and this RN insisted pt to let PA assess, but patient did not want to stay.

## 2017-11-24 ENCOUNTER — Other Ambulatory Visit: Payer: Self-pay

## 2017-11-24 ENCOUNTER — Emergency Department
Admission: EM | Admit: 2017-11-24 | Discharge: 2017-11-24 | Disposition: A | Discharge status: Home / Self Care | Payer: 59 | Source: Home / Self Care | Attending: Family Medicine | Admitting: Family Medicine

## 2017-11-24 DIAGNOSIS — R05 Cough: Secondary | ICD-10-CM | POA: Diagnosis not present

## 2017-11-24 DIAGNOSIS — R053 Chronic cough: Secondary | ICD-10-CM

## 2017-11-24 MED ORDER — AZITHROMYCIN 250 MG PO TABS: 250.0000 mg | ORAL_TABLET | Freq: Every day | ORAL | 0 refills | Status: DC

## 2017-11-24 MED ORDER — BENZONATATE 100 MG PO CAPS: 100.0000 mg | ORAL_CAPSULE | Freq: Three times a day (TID) | ORAL | 0 refills | Status: DC

## 2017-11-24 NOTE — ED Provider Notes (Signed)
[facility]    CSN: [id number] Arrival date & time: 11/24/17  1443     History   Chief Complaint Chief Complaint  Patient presents with  . Cough    HPI Durwin Glazeryn M Gabler is a 21 y.o. female.   HPI  Durwin Glazeryn M Schiffman is a 21 y.o. female presenting to UC with c/o mildly productive cough that started about 3 weeks ago.  Cough was more productive initially but now it is lingering and worse at night, usually dry.  She has tried OTC mucinex and allergy medication w/o relief. Denies fever, chills, n/v/d. Mild SOB when lying down at night from the congestion.  She notes her boyfriend had been sick but is already better.  She has a hx of pneumonia and became concerned when cough did not resolve over the last 3 weeks. No hx of asthma.    Past Medical History:  Diagnosis Date  . Anxiety   . Bipolar disorder (HCC)   . Depression   . PONV (postoperative nausea and vomiting)     Patient Active Problem List   Diagnosis Date Noted  . Cannabis use disorder, severe, dependence (HCC) 01/08/2017  . Hx of bipolar disorder 01/08/2017    Past Surgical History:  Procedure Laterality Date  . CHOLECYSTECTOMY N/A 09/25/2016   Procedure: LAPAROSCOPIC CHOLECYSTECTOMY;  Surgeon: Abigail Miyamotoouglas Blackman, MD;  Location: [facility] OR;  Service: General;  Laterality: N/A;  . WISDOM TOOTH EXTRACTION      OB History    No data available       Home Medications    Prior to Admission medications   Medication Sig Start Date End Date Taking? Authorizing Provider  amphetamine-dextroamphetamine (ADDERALL) 15 MG tablet Take 1 tablet by mouth 2 (two) times daily as needed. 12/23/16   [provider]  ARIPiprazole (ABILIFY) 2 MG tablet Take 1 tablet (2 mg total) by mouth daily. 01/09/17   Adonis BrookAgustin, Sheila, NP  azithromycin (ZITHROMAX) 250 MG tablet Take 1 tablet (250 mg total) by mouth daily. Take first 2 tablets together, then 1 every day until finished. 11/24/17   Lurene ShadowPhelps, Kassius Battiste O, PA-C  benzonatate  (TESSALON) 100 MG capsule Take 1-2 capsules (100-200 mg total) by mouth every 8 (eight) hours. 11/24/17   Lurene ShadowPhelps, Lashawne Dura O, PA-C  hydrOXYzine (ATARAX/VISTARIL) 25 MG tablet Take 1 tablet (25 mg total) by mouth every 6 (six) hours as needed for anxiety. 01/08/17   Adonis BrookAgustin, Sheila, NP  traZODone (DESYREL) 50 MG tablet Take 1 tablet (50 mg total) by mouth at bedtime as needed for sleep. 01/08/17   Adonis BrookAgustin, Sheila, NP  venlafaxine XR (EFFEXOR-XR) 75 MG 24 hr capsule Take 1 capsule (75 mg total) by mouth daily with breakfast. 01/09/17   Adonis BrookAgustin, Sheila, NP    Family History History reviewed. No pertinent family history.  Social History Social History   Tobacco Use  . Smoking status: Former Smoker    Years: 4.00    Last attempt to quit: 09/25/2014    Years since quitting: 3.1  . Smokeless tobacco: Never Used  . Tobacco comment: Hookah   Substance Use Topics  . Alcohol use: Yes    Comment: occ  . Drug use: Yes    Frequency: 7.0 times per week    Types: Marijuana    Comment: daily user      Allergies   Oxycodone-acetaminophen and Codeine   Review of Systems Review of Systems  Constitutional: Negative for chills and fever.  HENT: Positive for congestion and postnasal drip.  Negative for ear pain, sore throat, trouble swallowing and voice change.   Respiratory: Positive for cough, chest tightness and shortness of breath ( minimal). Negative for wheezing.   Cardiovascular: Negative for chest pain and palpitations.  Gastrointestinal: Negative for abdominal pain, diarrhea, nausea and vomiting.  Musculoskeletal: Negative for arthralgias, back pain and myalgias.  Skin: Negative for rash.  Neurological: Negative for dizziness, light-headedness and headaches.     Physical Exam Triage Vital Signs ED Triage Vitals  Enc Vitals Group     BP 11/24/17 1508 121/82     Pulse Rate 11/24/17 1508 91     Resp --      Temp 11/24/17 1508 97.9 F (36.6 C)     Temp Source 11/24/17 1508 Oral     SpO2  11/24/17 1508 97 %     Weight 11/24/17 1509 204 lb (92.5 kg)     Height 11/24/17 1509 5\' 5"  (1.651 m)     Head Circumference --      Peak Flow --      Pain Score 11/24/17 1510 2     Pain Loc --      Pain Edu? --      Excl. in GC? --    No data found.  Updated Vital Signs BP 121/82 (BP Location: Right Arm)   Pulse 91   Temp 97.9 F (36.6 C) (Oral)   Ht 5\' 5"  (1.651 m)   Wt 204 lb (92.5 kg)   LMP 11/16/2017   SpO2 97%   BMI 33.95 kg/m   Visual Acuity Right Eye Distance:   Left Eye Distance:   Bilateral Distance:    Right Eye Near:   Left Eye Near:    Bilateral Near:     Physical Exam  Constitutional: She is oriented to person, place, and time. She appears well-developed and well-nourished. No distress.  HENT:  Head: Normocephalic and atraumatic.  Right Ear: Tympanic membrane normal.  Left Ear: Tympanic membrane normal.  Nose: Mucosal edema present. Right sinus exhibits no maxillary sinus tenderness and no frontal sinus tenderness. Left sinus exhibits no maxillary sinus tenderness and no frontal sinus tenderness.  Eyes: EOM are normal.  Neck: Normal range of motion. Neck supple.  Cardiovascular: Normal rate and regular rhythm.  Pulmonary/Chest: Effort normal and breath sounds normal. No stridor. No respiratory distress. She has no wheezes. She has no rales.  Musculoskeletal: Normal range of motion.  Lymphadenopathy:    She has no cervical adenopathy.  Neurological: She is alert and oriented to person, place, and time.  Skin: Skin is warm and dry. She is not diaphoretic.  Psychiatric: She has a normal mood and affect. Her behavior is normal.  Nursing note and vitals reviewed.    UC Treatments / Results  Labs (all labs ordered are listed, but only abnormal results are displayed) Labs Reviewed - No data to display  EKG  EKG Interpretation None       Radiology No results found.  Procedures Procedures (including critical care time)  Medications Ordered  in UC Medications - No data to display   Initial Impression / Assessment and Plan / UC Course  I have reviewed the triage vital signs and the nursing notes.  Pertinent labs & imaging results that were available during my care of the patient were reviewed by me and considered in my medical decision making (see chart for details).     Hx and exam c/w URI Lungs: CTAB, however, due to duration of cough, will  cover for atypical bacteria F/u with PCP in 1 week if needed.   Final Clinical Impressions(s) / UC Diagnoses   Final diagnoses:  Persistent cough for 3 weeks or longer    ED Discharge Orders        Ordered    azithromycin (ZITHROMAX) 250 MG tablet  Daily     11/24/17 1518    benzonatate (TESSALON) 100 MG capsule  Every 8 hours     11/24/17 1518       Controlled Substance Prescriptions [place] Controlled Substance Registry consulted? Not Applicable   Rolla Plate 11/24/17 6962

## 2017-11-24 NOTE — ED Triage Notes (Signed)
Started with a cough/ allergy about 2-3 weeks ago.  Getting more difficult to sleep due to coughing.

## 2018-08-08 IMAGING — US US ABDOMEN LIMITED
1 series · 14 of 25 positions shown · non-contrast
Comparison: Prior ultrasound from 07/22/2016.

CLINICAL DATA: Initial evaluation for acute right upper quadrant
abdominal pain.

EXAM:
US ABDOMEN LIMITED - RIGHT UPPER QUADRANT

[Series 1: us abdomen limited · 0.17mm/px · 14 of 150 slices shown]
[im 1/150]
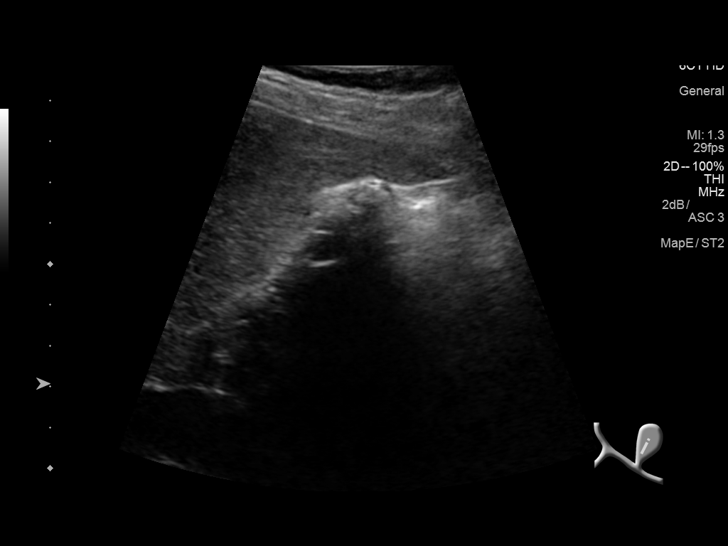
[im 13/150]
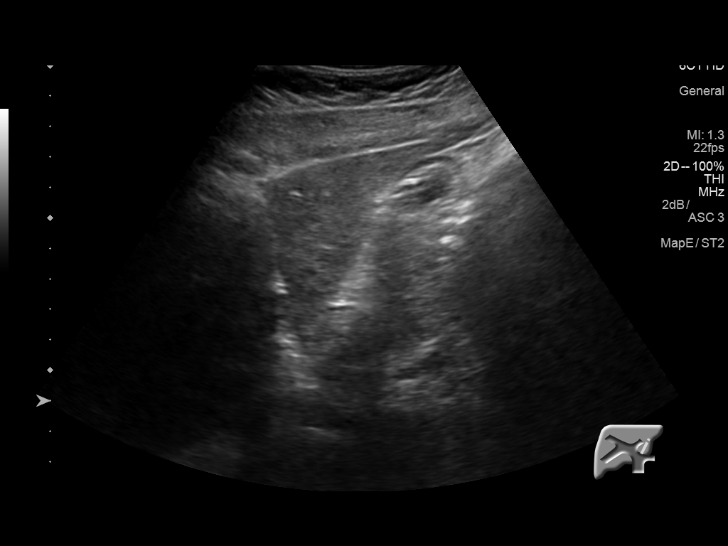
[im 25/150]
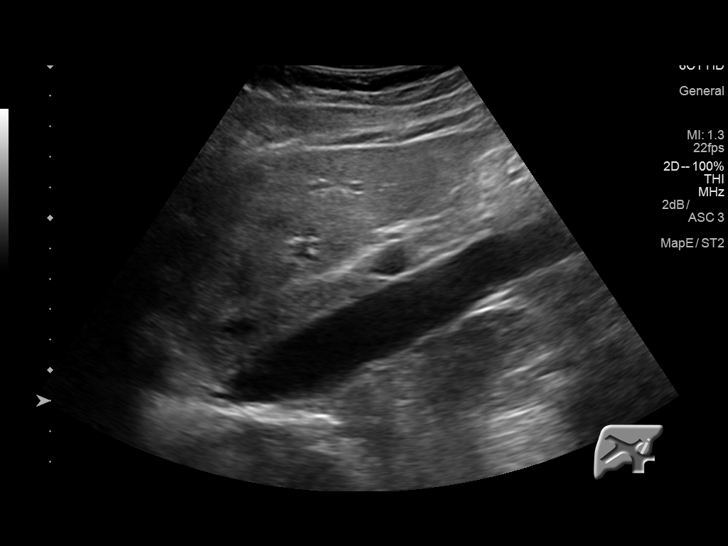
[im 38/150]
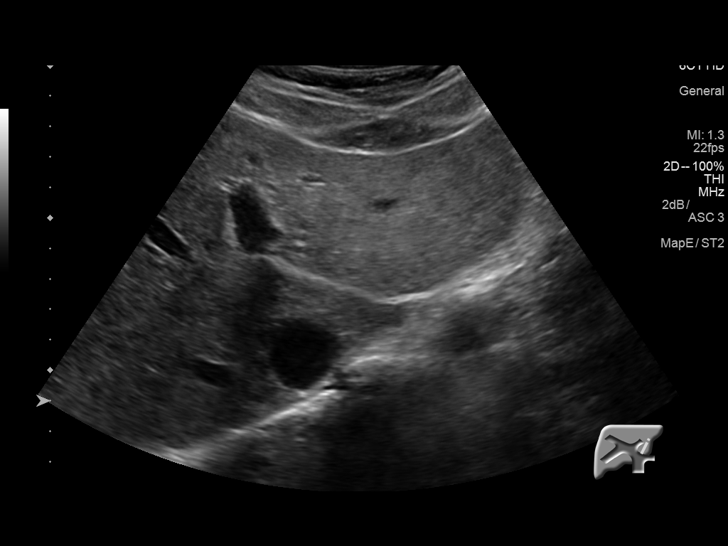
[im 50/150]
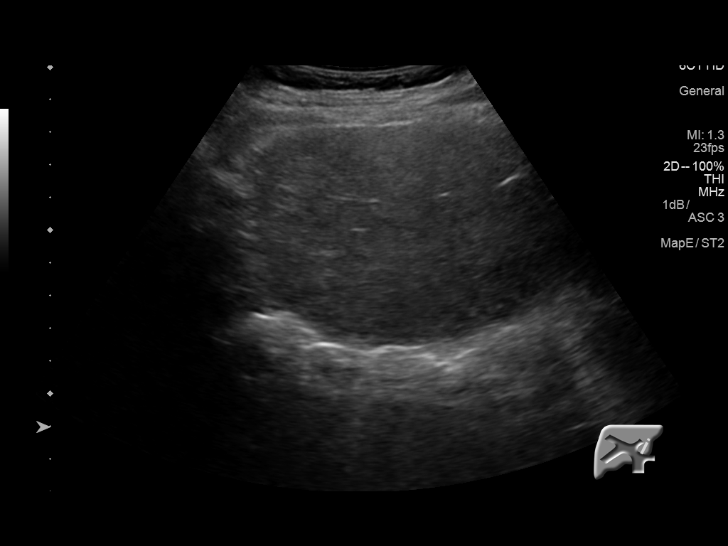
[im 56/150]
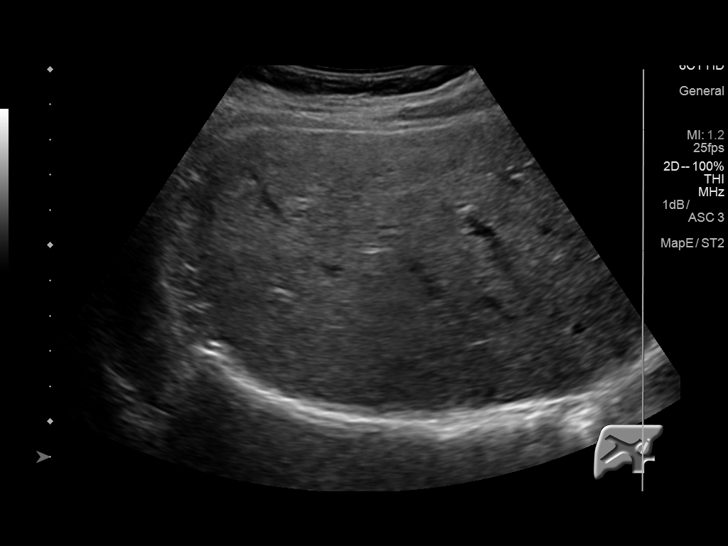
[im 69/150]
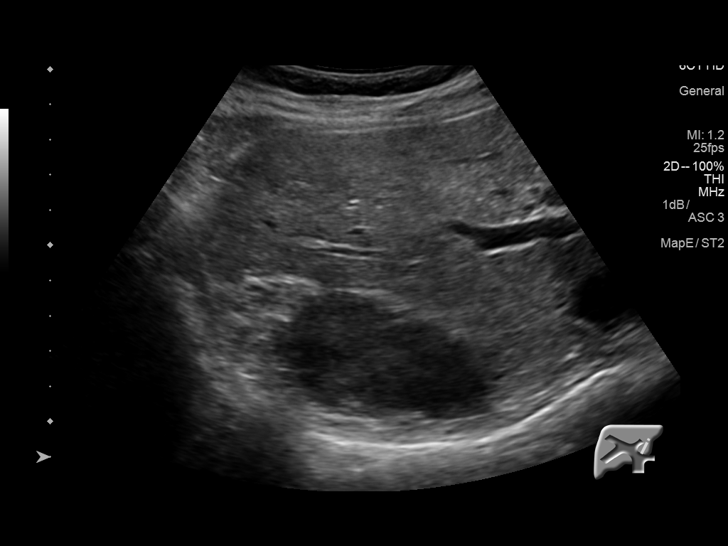
[im 81/150]
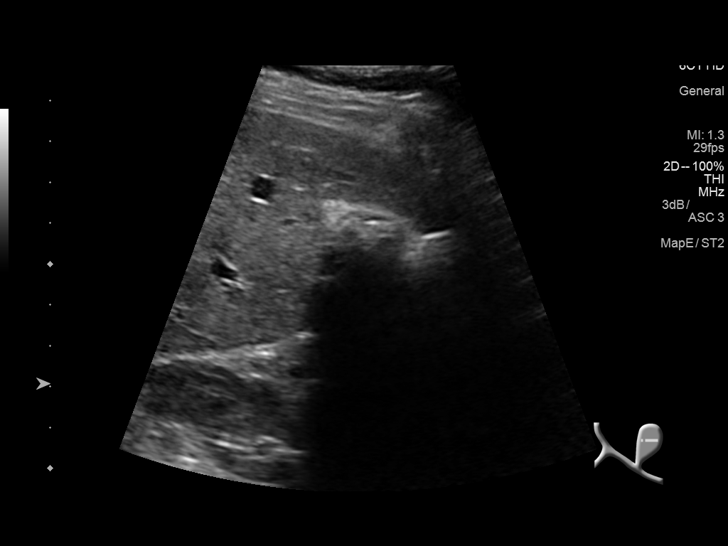
[im 94/150]
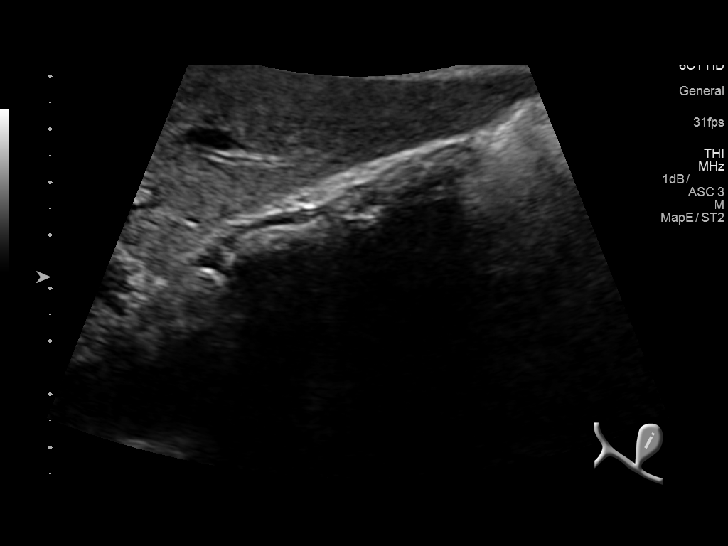
[im 100/150]
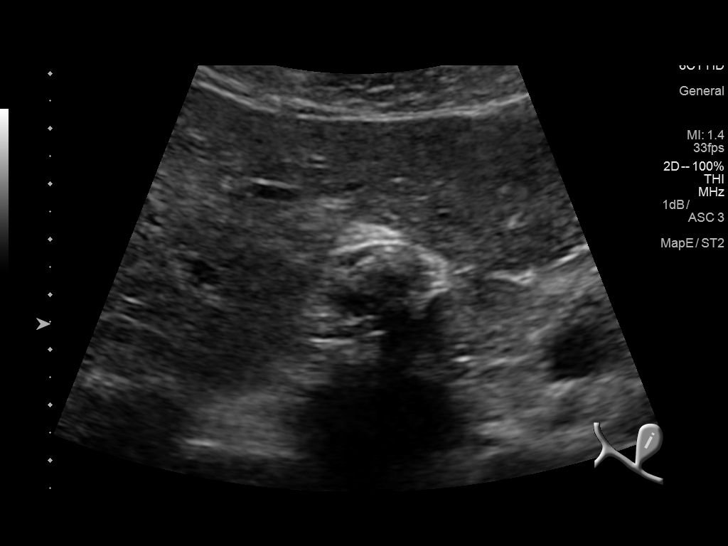
[im 112/150]
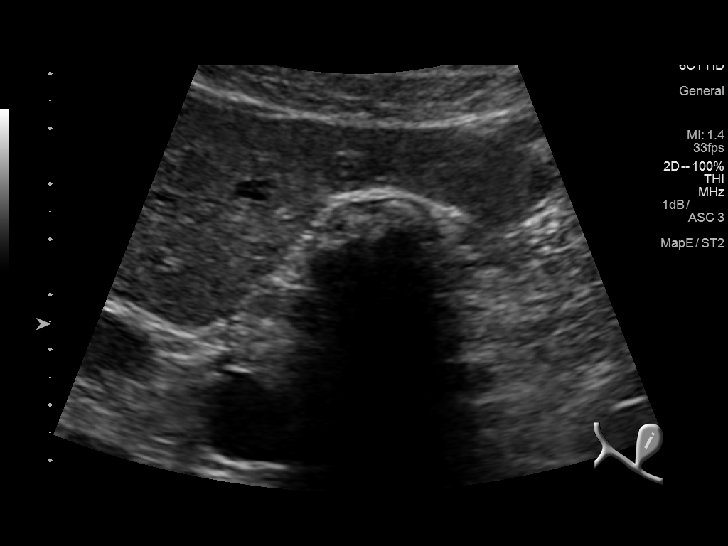
[im 125/150]
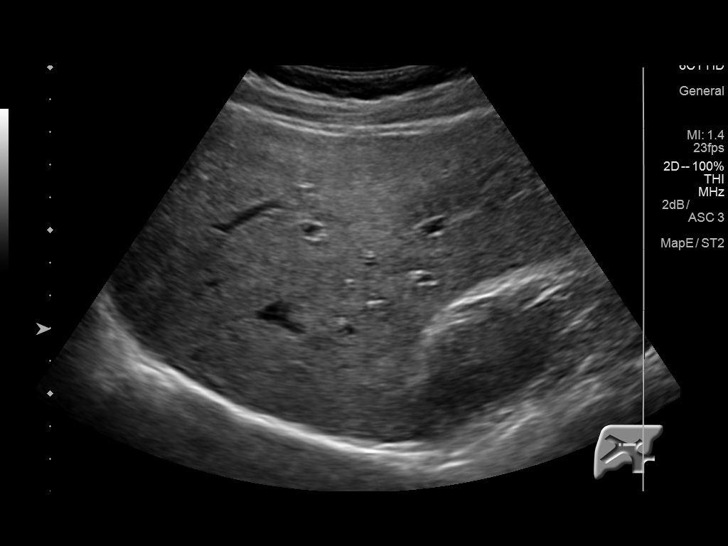
[im 137/150]
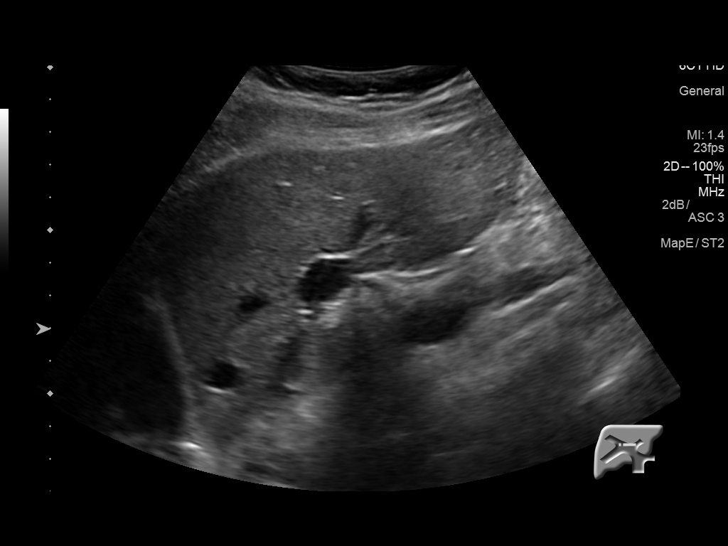
[im 150/150]
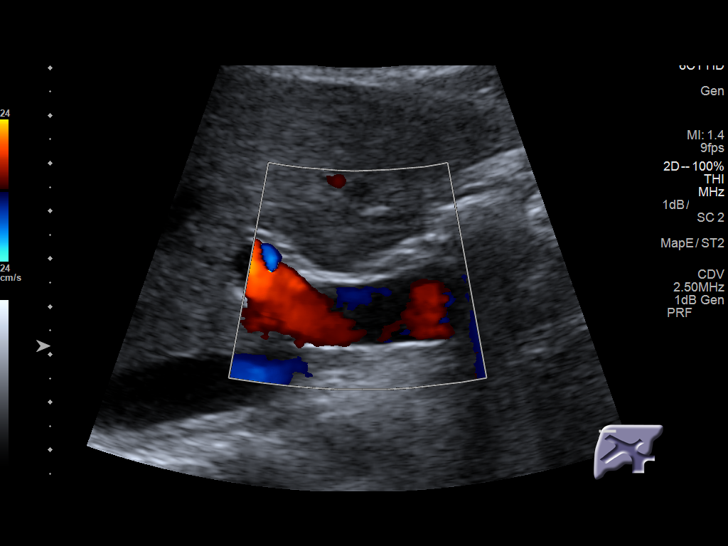

[14 of 25 positions shown; findings below may reference images not displayed]

FINDINGS: Gallbladder:

Multiple echogenic shadowing stones fill the gallbladder lumen,
largest of which measured 13 mm. Gallbladder wall at the upper
limits of normal measuring 3-4 mm. No free pericholecystic fluid. No
sonographic Murphy sign elicited on exam.

Common bile duct:

Diameter: 3.5 mm

Liver:

No focal lesion identified. Increased echogenicity, suggestive of
steatosis.
IMPRESSION: 1. Cholelithiasis without other sonographic features for acute
cholecystitis. No biliary dilatation.
2. Hepatic steatosis.

## 2019-09-12 ENCOUNTER — Other Ambulatory Visit: Payer: Self-pay

## 2019-09-12 ENCOUNTER — Emergency Department
Admission: EM | Admit: 2019-09-12 | Discharge: 2019-09-12 | Disposition: A | Discharge status: Home / Self Care | Payer: 59 | Source: Home / Self Care

## 2019-09-12 ENCOUNTER — Encounter: Payer: Self-pay | Admitting: Emergency Medicine

## 2019-09-12 DIAGNOSIS — R05 Cough: Secondary | ICD-10-CM

## 2019-09-12 DIAGNOSIS — F1220 Cannabis dependence, uncomplicated: Secondary | ICD-10-CM

## 2019-09-12 DIAGNOSIS — R053 Chronic cough: Secondary | ICD-10-CM

## 2019-09-12 DIAGNOSIS — Z8659 Personal history of other mental and behavioral disorders: Secondary | ICD-10-CM

## 2019-09-12 LAB — POC SARS CORONAVIRUS 2 AG -  ED: SARS Coronavirus 2 Ag: NEGATIVE

## 2019-09-12 MED ORDER — BENZONATATE 100 MG PO CAPS: 100.0000 mg | ORAL_CAPSULE | Freq: Three times a day (TID) | ORAL | 0 refills | Status: DC

## 2019-09-12 MED ORDER — IPRATROPIUM BROMIDE 0.06 % NA SOLN: 2.0000 | Freq: Four times a day (QID) | NASAL | 1 refills | Status: DC

## 2019-09-12 NOTE — Discharge Instructions (Signed)
°  Please follow up with family medicine in 1 week if medication prescribed is still not helping. Follow up sooner if you develop chest pain, trouble breathing, or fever.

## 2019-09-12 NOTE — ED Provider Notes (Signed)
[facility]    CSN: [id number] Arrival date & time: 09/12/19  1126      History   Chief Complaint Chief Complaint  Patient presents with  . Cough    HPI Lisa Fitzpatrick is a 23 y.o. female.   HPI Lisa Fitzpatrick is a 23 y.o. female presenting to UC with c/o cough for 3 weeks.  She initially had a mild headache and congestion, which has since resolved but cough has lingered, usually worse at night when lying down. She did take OTC cough/cold Robitussin for about 1 week, which was working but stopped taking it once it stopped helping her cough at night.  No known sick contacts or recent travel. Denies fever, chills, n/v/d.   Past Medical History:  Diagnosis Date  . Anxiety   . Bipolar disorder ([place])   . Depression   . PONV (postoperative nausea and vomiting)     Patient Active Problem List   Diagnosis Date Noted  . Cannabis use disorder, severe, dependence ([place]) 01/08/2017  . Hx of bipolar disorder 01/08/2017    Past Surgical History:  Procedure Laterality Date  . CHOLECYSTECTOMY N/A 09/25/2016   Procedure: LAPAROSCOPIC CHOLECYSTECTOMY;  Surgeon: Coralie Keens, MD;  Location: [place];  Service: General;  Laterality: N/A;  . WISDOM TOOTH EXTRACTION      OB History   No obstetric history on file.      Home Medications    Prior to Admission medications   Medication Sig Start Date End Date Taking? Authorizing Provider  benzonatate (TESSALON) 100 MG capsule Take 1-2 capsules (100-200 mg total) by mouth every 8 (eight) hours. 09/12/19   Noe Gens, PA-C  ipratropium (ATROVENT) 0.06 % nasal spray Place 2 sprays into both nostrils 4 (four) times daily. 09/12/19   Noe Gens, PA-C    Family History Family History  Adopted: Yes  Family history unknown: Yes    Social History Social History   Tobacco Use  . Smoking status: Former Smoker    Years: 4.00    Quit date: 09/25/2014    Years since quitting: 4.9  . Smokeless tobacco: Never Used  .  Tobacco comment: Hookah   Substance Use Topics  . Alcohol use: Yes    Comment: occ  . Drug use: Yes    Frequency: 7.0 times per week    Types: Marijuana    Comment: daily user      Allergies   Oxycodone-acetaminophen and Codeine   Review of Systems Review of Systems  Constitutional: Negative for chills and fever.  HENT: Positive for congestion (mild). Negative for ear pain, sore throat, trouble swallowing and voice change.   Respiratory: Positive for cough. Negative for shortness of breath.   Cardiovascular: Negative for chest pain and palpitations.  Gastrointestinal: Negative for abdominal pain, diarrhea, nausea and vomiting.  Musculoskeletal: Negative for arthralgias, back pain and myalgias.  Skin: Negative for rash.  Neurological: Negative for dizziness, light-headedness and headaches.     Physical Exam Triage Vital Signs ED Triage Vitals  Enc Vitals Group     BP      Pulse      Resp      Temp      Temp src      SpO2      Weight      Height      Head Circumference      Peak Flow      Pain Score      Pain  Loc      Pain Edu?      Excl. in GC?    No data found.  Updated Vital Signs BP 122/81 (BP Location: Right Arm)   Pulse 71   Temp 98.1 F (36.7 C) (Oral)   Ht 5\' 5"  (1.651 m)   Wt 230 lb (104.3 kg)   LMP 09/02/2019 (Exact Date)   SpO2 97%   BMI 38.27 kg/m   Visual Acuity Right Eye Distance:   Left Eye Distance:   Bilateral Distance:    Right Eye Near:   Left Eye Near:    Bilateral Near:     Physical Exam Vitals signs and nursing note reviewed.  Constitutional:      Appearance: Normal appearance. She is well-developed.  HENT:     Head: Normocephalic and atraumatic.     Right Ear: Tympanic membrane normal.     Left Ear: Tympanic membrane normal.     Nose: Nose normal.     Right Sinus: No maxillary sinus tenderness or frontal sinus tenderness.     Left Sinus: No maxillary sinus tenderness or frontal sinus tenderness.     Mouth/Throat:      Lips: Pink.     Mouth: Mucous membranes are moist.     Pharynx: Oropharynx is clear. Uvula midline.  Neck:     Musculoskeletal: Normal range of motion.  Cardiovascular:     Rate and Rhythm: Normal rate and regular rhythm.  Pulmonary:     Effort: Pulmonary effort is normal. No respiratory distress.     Breath sounds: Normal breath sounds. No stridor. No wheezing, rhonchi or rales.  Musculoskeletal: Normal range of motion.  Skin:    General: Skin is warm and dry.  Neurological:     Mental Status: She is alert and oriented to person, place, and time.  Psychiatric:        Behavior: Behavior normal.      UC Treatments / Results  Labs (all labs ordered are listed, but only abnormal results are displayed) Labs Reviewed  POC SARS CORONAVIRUS 2 ED    EKG   Radiology No results found.  Procedures Procedures (including critical care time)  Medications Ordered in UC Medications - No data to display  Initial Impression / Assessment and Plan / UC Course  I have reviewed the triage vital signs and the nursing notes.  Pertinent labs & imaging results that were available during my care of the patient were reviewed by me and considered in my medical decision making (see chart for details).    Rapid Covid- Negative Lungs: CTAB, O2 Sat 97% on RA Will have pt try trial of ipratropium nasal spray and tessalon Offered CXR, pt agreeable to try prescriptions first as she denies feeling chest congestion or SOB. Will f/u if not improving AVS provided  Final Clinical Impressions(s) / UC Diagnoses   Final diagnoses:  Persistent cough for 3 weeks or longer  Cannabis use disorder, severe, dependence (HCC)  Hx of bipolar disorder     Discharge Instructions      Please follow up with family medicine in 1 week if medication prescribed is still not helping. Follow up sooner if you develop chest pain, trouble breathing, or fever.     ED Prescriptions    Medication Sig Dispense  Auth. Provider   ipratropium (ATROVENT) 0.06 % nasal spray Place 2 sprays into both nostrils 4 (four) times daily. 15 mL Sanda Dejoy O, PA-C   benzonatate (TESSALON) 100 MG capsule Take  1-2 capsules (100-200 mg total) by mouth every 8 (eight) hours. 21 capsule Lurene ShadowPhelps, Titilayo Hagans O, PA-C     Controlled Substance Prescriptions [place] Controlled Substance Registry consulted? Not Applicable   Rolla Platehelps, Larry Knipp O, PA-C 09/12/19 1511

## 2019-09-12 NOTE — ED Triage Notes (Signed)
Productive Cough x 3 weeks, light green mucus

## 2020-11-13 ENCOUNTER — Emergency Department
Admission: RE | Admit: 2020-11-13 | Discharge: 2020-11-13 | Disposition: A | Discharge status: Home / Self Care | Payer: Self-pay | Source: Ambulatory Visit | Attending: Family Medicine | Admitting: Family Medicine

## 2020-11-13 ENCOUNTER — Other Ambulatory Visit: Payer: Self-pay

## 2020-11-13 VITALS — BP 113/72 | HR 71 | Temp 98.0°F | Resp 18

## 2020-11-13 DIAGNOSIS — O210 Mild hyperemesis gravidarum: Secondary | ICD-10-CM

## 2020-11-13 MED ORDER — PROMETHAZINE HCL 12.5 MG PO TABS: ORAL_TABLET | ORAL | 0 refills | Status: AC

## 2020-11-13 NOTE — ED Triage Notes (Signed)
Pt states she took zofran approximately 0800 this am and has none left at home from the ER visit script.

## 2020-11-13 NOTE — ED Triage Notes (Signed)
Pt states she is [redacted] weeks pregnant and has been experiencing severe nausea and vomiting for several weeks. Pt is aox4 and ambulatory.

## 2020-11-13 NOTE — ED Provider Notes (Signed)
[facility]    CSN: [id number] Arrival date & time: 11/13/20  0946      History   Chief Complaint Chief Complaint  Patient presents with  . Nausea    Morning sickness due to pregnancy    HPI Lisa Fitzpatrick is a 24 y.o. female.   Patient is now seven weeks pregnant and has been experiencing severe nausea and vomiting for several weeks.  She had a thorough evaluation in the [facility] ED five days ago and prescribed Zofran.  She states that the Zofran has been effective but becoming less so.  She denies abdominal/pelvic pain, vaginal bleeding and fevers, chills, and sweats.  She has not yet established care with an OB.  The history is provided by the patient.  Emesis Severity:  Moderate Timing:  Intermittent Quality:  Stomach contents Able to tolerate:  Liquids Progression:  Unchanged Chronicity:  New Relieved by:  Antiemetics Exacerbated by: food. Associated symptoms: no abdominal pain, no arthralgias, no chills, no cough, no diarrhea, no fever, no headaches, no myalgias, no sore throat and no URI   Risk factors: pregnant     Past Medical History:  Diagnosis Date  . Anxiety   . Bipolar disorder (HCC)   . Depression   . PONV (postoperative nausea and vomiting)     Patient Active Problem List   Diagnosis Date Noted  . Cannabis use disorder, severe, dependence (HCC) 01/08/2017  . Hx of bipolar disorder 01/08/2017    Past Surgical History:  Procedure Laterality Date  . CHOLECYSTECTOMY N/A 09/25/2016   Procedure: LAPAROSCOPIC CHOLECYSTECTOMY;  Surgeon: Abigail Miyamoto, MD;  Location: [facility] OR;  Service: General;  Laterality: N/A;  . WISDOM TOOTH EXTRACTION      OB History   No obstetric history on file.      Home Medications    Prior to Admission medications   Medication Sig Start Date End Date Taking? Authorizing Provider  benzonatate (TESSALON) 100 MG capsule Take 1-2 capsules (100-200 mg total) by mouth every 8 (eight) hours. 09/12/19   Lurene Shadow, PA-C  ipratropium (ATROVENT) 0.06 % nasal spray Place 2 sprays into both nostrils 4 (four) times daily. 09/12/19   Lurene Shadow, PA-C  promethazine (PHENERGAN) 12.5 MG tablet May take one or two tabs PO Q6hr PRN nausea 11/13/20   Lattie Haw, MD    Family History Family History  Adopted: Yes  Family history unknown: Yes    Social History Social History   Tobacco Use  . Smoking status: Former Smoker    Years: 4.00    Quit date: 09/25/2014    Years since quitting: 6.1  . Smokeless tobacco: Never Used  . Tobacco comment: Hookah   Substance Use Topics  . Alcohol use: Yes    Comment: occ  . Drug use: Yes    Frequency: 7.0 times per week    Types: Marijuana    Comment: daily user      Allergies   Oxycodone-acetaminophen and Codeine   Review of Systems Review of Systems  Constitutional: Negative for chills and fever.  HENT: Negative for sore throat.   Respiratory: Negative for cough.   Gastrointestinal: Positive for vomiting. Negative for abdominal pain and diarrhea.  Musculoskeletal: Negative for arthralgias and myalgias.  Neurological: Negative for headaches.  All other systems reviewed and are negative.    Physical Exam Triage Vital Signs ED Triage Vitals  Enc Vitals Group     BP 11/13/20 1004 113/72  Pulse Rate 11/13/20 1004 71     Resp 11/13/20 1004 18     Temp 11/13/20 1004 98 F (36.7 C)     Temp Source 11/13/20 1004 Oral     SpO2 11/13/20 1004 97 %     Weight --      Height --      Head Circumference --      Peak Flow --      Pain Score 11/13/20 1005 0     Pain Loc --      Pain Edu? --      Excl. in GC? --    No data found.  Updated Vital Signs BP 113/72   Pulse 71   Temp 98 F (36.7 C) (Oral)   Resp 18   SpO2 97%   Visual Acuity Right Eye Distance:   Left Eye Distance:   Bilateral Distance:    Right Eye Near:   Left Eye Near:    Bilateral Near:     Physical Exam Nursing notes and Vital Signs reviewed. Appearance:   Patient appears stated age, and in no acute distress.    Eyes:  Pupils are equal, round, and reactive to light and accomodation.  Extraocular movement is intact.  Conjunctivae are not inflamed   Pharynx:  Normal; moist mucous membranes  Neck:  Supple.  No adenopathy Lungs:  Clear to auscultation.  Breath sounds are equal.  Moving air well. Heart:  Regular rate and rhythm without murmurs, rubs, or gallops.  Abdomen:  Nontender without masses or hepatosplenomegaly.  Bowel sounds are present.  No CVA or flank tenderness.  Extremities:  No edema.  Skin:  No rash present.     UC Treatments / Results  Labs (all labs ordered are listed, but only abnormal results are displayed) Labs Reviewed - No data to display  EKG   Radiology No results found.  Procedures Procedures (including critical care time)  Medications Ordered in UC Medications - No data to display  Initial Impression / Assessment and Plan / UC Course  I have reviewed the triage vital signs and the nursing notes.  Pertinent labs & imaging results that were available during my care of the patient were reviewed by me and considered in my medical decision making (see chart for details).    Recommend stopping Zofran.  Rx for promethazine 12.5mg . Followup with OB as soon as possible for management.   Final Clinical Impressions(s) / UC Diagnoses   Final diagnoses:  Hyperemesis gravidarum     Discharge Instructions     May try Emetrol as needed for nausea. May try small amounts of fresh ginger for nausea.    ED Prescriptions    Medication Sig Dispense Auth. Provider   promethazine (PHENERGAN) 12.5 MG tablet May take one or two tabs PO Q6hr PRN nausea 30 tablet Lattie Haw, MD        Lattie Haw, MD 11/15/20 [phone]

## 2020-11-13 NOTE — Discharge Instructions (Addendum)
May try Emetrol as needed for nausea. May try small amounts of fresh ginger for nausea.
